# Patient Record
Sex: Female | Born: 1950
Health system: Southern US, Community
[De-identification: ages and names within clinical notes are randomized; demographics above are authoritative.]

## PROBLEM LIST (undated history)

## (undated) DIAGNOSIS — K219 Gastro-esophageal reflux disease without esophagitis: Secondary | ICD-10-CM

## (undated) DIAGNOSIS — E039 Hypothyroidism, unspecified: Secondary | ICD-10-CM

## (undated) DIAGNOSIS — E119 Type 2 diabetes mellitus without complications: Secondary | ICD-10-CM

## (undated) DIAGNOSIS — F419 Anxiety disorder, unspecified: Secondary | ICD-10-CM

## (undated) DIAGNOSIS — K5900 Constipation, unspecified: Secondary | ICD-10-CM

## (undated) DIAGNOSIS — M1990 Unspecified osteoarthritis, unspecified site: Secondary | ICD-10-CM

## (undated) DIAGNOSIS — R112 Nausea with vomiting, unspecified: Secondary | ICD-10-CM

## (undated) DIAGNOSIS — G8929 Other chronic pain: Secondary | ICD-10-CM

## (undated) DIAGNOSIS — Z9889 Other specified postprocedural states: Secondary | ICD-10-CM

## (undated) HISTORY — DX: Constipation, unspecified: K59.00

## (undated) HISTORY — DX: Gastro-esophageal reflux disease without esophagitis: K21.9

## (undated) HISTORY — PX: ABDOMINAL HYSTERECTOMY: SHX81

## (undated) HISTORY — DX: Other chronic pain: G89.29

## (undated) HISTORY — DX: Type 2 diabetes mellitus without complications: E11.9

## (undated) HISTORY — DX: Hypothyroidism, unspecified: E03.9

## (undated) HISTORY — PX: COLONOSCOPY: SHX174

## (undated) HISTORY — PX: PILONIDAL CYST EXCISION: SHX744

## (undated) HISTORY — PX: TONSILLECTOMY: SUR1361

## (undated) HISTORY — PX: ESOPHAGOGASTRODUODENOSCOPY: SHX1529

## (undated) HISTORY — PX: CHOLECYSTECTOMY: SHX55

---

## 2000-03-07 ENCOUNTER — Ambulatory Visit (HOSPITAL_COMMUNITY): Admission: RE | Admit: 2000-03-07 | Discharge: 2000-03-07 | Payer: Self-pay | Admitting: Gastroenterology

## 2000-03-07 ENCOUNTER — Encounter: Payer: Self-pay | Admitting: Gastroenterology

## 2000-12-17 ENCOUNTER — Encounter: Payer: Self-pay | Admitting: Internal Medicine

## 2000-12-17 ENCOUNTER — Ambulatory Visit (HOSPITAL_COMMUNITY): Admission: RE | Admit: 2000-12-17 | Discharge: 2000-12-17 | Payer: Self-pay | Admitting: Internal Medicine

## 2003-08-14 ENCOUNTER — Ambulatory Visit (HOSPITAL_COMMUNITY): Admission: RE | Admit: 2003-08-14 | Discharge: 2003-08-14 | Payer: Self-pay | Admitting: Pulmonary Disease

## 2004-08-04 ENCOUNTER — Ambulatory Visit (HOSPITAL_COMMUNITY): Admission: RE | Admit: 2004-08-04 | Discharge: 2004-08-04 | Payer: Self-pay | Admitting: Pulmonary Disease

## 2004-08-04 ENCOUNTER — Encounter: Payer: Self-pay | Admitting: Orthopedic Surgery

## 2004-08-25 ENCOUNTER — Encounter (HOSPITAL_COMMUNITY): Admission: RE | Admit: 2004-08-25 | Discharge: 2004-09-24 | Payer: Self-pay | Admitting: Neurosurgery

## 2004-09-29 ENCOUNTER — Encounter (HOSPITAL_COMMUNITY): Admission: RE | Admit: 2004-09-29 | Discharge: 2004-10-29 | Payer: Self-pay | Admitting: Neurosurgery

## 2005-02-07 ENCOUNTER — Ambulatory Visit (HOSPITAL_COMMUNITY): Admission: RE | Admit: 2005-02-07 | Discharge: 2005-02-07 | Payer: Self-pay | Admitting: Pulmonary Disease

## 2006-02-05 ENCOUNTER — Ambulatory Visit (HOSPITAL_COMMUNITY): Admission: RE | Admit: 2006-02-05 | Discharge: 2006-02-05 | Payer: Self-pay | Admitting: Pulmonary Disease

## 2006-02-14 ENCOUNTER — Ambulatory Visit: Payer: Self-pay | Admitting: Cardiology

## 2006-02-19 ENCOUNTER — Encounter (HOSPITAL_COMMUNITY): Admission: RE | Admit: 2006-02-19 | Discharge: 2006-03-21 | Payer: Self-pay | Admitting: Cardiology

## 2006-02-20 ENCOUNTER — Ambulatory Visit: Payer: Self-pay | Admitting: Cardiology

## 2006-03-15 ENCOUNTER — Ambulatory Visit: Payer: Self-pay | Admitting: Cardiology

## 2006-06-20 ENCOUNTER — Ambulatory Visit (HOSPITAL_COMMUNITY): Admission: RE | Admit: 2006-06-20 | Discharge: 2006-06-20 | Payer: Self-pay | Admitting: Pulmonary Disease

## 2006-06-21 ENCOUNTER — Ambulatory Visit: Payer: Self-pay | Admitting: Orthopedic Surgery

## 2006-08-22 ENCOUNTER — Ambulatory Visit: Payer: Self-pay | Admitting: Orthopedic Surgery

## 2008-07-06 ENCOUNTER — Ambulatory Visit: Payer: Self-pay | Admitting: Gastroenterology

## 2008-07-20 ENCOUNTER — Encounter: Payer: Self-pay | Admitting: Internal Medicine

## 2008-07-20 ENCOUNTER — Ambulatory Visit: Payer: Self-pay | Admitting: Internal Medicine

## 2008-07-20 ENCOUNTER — Ambulatory Visit (HOSPITAL_COMMUNITY): Admission: RE | Admit: 2008-07-20 | Discharge: 2008-07-20 | Payer: Self-pay | Admitting: Internal Medicine

## 2008-09-18 ENCOUNTER — Encounter (INDEPENDENT_AMBULATORY_CARE_PROVIDER_SITE_OTHER): Payer: Self-pay | Admitting: *Deleted

## 2008-10-07 ENCOUNTER — Ambulatory Visit (HOSPITAL_COMMUNITY): Admission: RE | Admit: 2008-10-07 | Discharge: 2008-10-07 | Payer: Self-pay | Admitting: Pulmonary Disease

## 2008-10-12 DIAGNOSIS — E039 Hypothyroidism, unspecified: Secondary | ICD-10-CM | POA: Insufficient documentation

## 2008-10-12 DIAGNOSIS — R109 Unspecified abdominal pain: Secondary | ICD-10-CM | POA: Insufficient documentation

## 2008-10-12 DIAGNOSIS — R198 Other specified symptoms and signs involving the digestive system and abdomen: Secondary | ICD-10-CM | POA: Insufficient documentation

## 2008-10-12 DIAGNOSIS — M109 Gout, unspecified: Secondary | ICD-10-CM | POA: Insufficient documentation

## 2008-10-12 DIAGNOSIS — G43909 Migraine, unspecified, not intractable, without status migrainosus: Secondary | ICD-10-CM | POA: Insufficient documentation

## 2008-10-12 DIAGNOSIS — F341 Dysthymic disorder: Secondary | ICD-10-CM | POA: Insufficient documentation

## 2008-10-12 DIAGNOSIS — K5900 Constipation, unspecified: Secondary | ICD-10-CM | POA: Insufficient documentation

## 2008-10-12 DIAGNOSIS — E669 Obesity, unspecified: Secondary | ICD-10-CM | POA: Insufficient documentation

## 2008-10-12 DIAGNOSIS — R142 Eructation: Secondary | ICD-10-CM

## 2008-10-12 DIAGNOSIS — K222 Esophageal obstruction: Secondary | ICD-10-CM | POA: Insufficient documentation

## 2008-10-12 DIAGNOSIS — R1319 Other dysphagia: Secondary | ICD-10-CM | POA: Insufficient documentation

## 2008-10-12 DIAGNOSIS — K219 Gastro-esophageal reflux disease without esophagitis: Secondary | ICD-10-CM | POA: Insufficient documentation

## 2008-10-12 DIAGNOSIS — R141 Gas pain: Secondary | ICD-10-CM | POA: Insufficient documentation

## 2008-10-12 DIAGNOSIS — R143 Flatulence: Secondary | ICD-10-CM

## 2008-10-12 DIAGNOSIS — K3184 Gastroparesis: Secondary | ICD-10-CM | POA: Insufficient documentation

## 2008-10-12 DIAGNOSIS — R1010 Upper abdominal pain, unspecified: Secondary | ICD-10-CM | POA: Insufficient documentation

## 2009-06-01 ENCOUNTER — Encounter: Payer: Self-pay | Admitting: Orthopedic Surgery

## 2009-10-20 ENCOUNTER — Encounter: Payer: Self-pay | Admitting: Gastroenterology

## 2010-08-02 NOTE — Medication Information (Signed)
Summary: Tax adviser   Imported By: Diana Eves 10/20/2009 16:34:17  _____________________________________________________________________  External Attachment:    Type:   Image     Comment:   External Document  Appended Document: RX Folder Pt last seen in 2010. Nexium 40 mg daily #30 rfx2. Pt needs OPV w/ RMR.  Appended Document: RX Folder pharmacy aware  Appended Document: RX Folder pt aware of appt for 12/01/09 at 1am w/RMR

## 2010-11-15 NOTE — Op Note (Signed)
NAME:  Whitney Reed, Whitney Reed            ACCOUNT NO.:  [id number]   MEDICAL RECORD NO.:  [id number]          PATIENT TYPE:  AMB   LOCATION:  DAY                           FACILITY:  [facility]   PHYSICIAN:  R. Roetta Sessions, M.D. DATE OF BIRTH:  01/21/1951   DATE OF PROCEDURE:  07/20/2008  DATE OF DISCHARGE:                                PROCEDURE NOTE   INDICATIONS FOR PROCEDURE:  A 60 year old lady with longstanding chronic  reflux symptoms and recurrent esophageal dysphagia, history of Schatzki  ring, also alteration in bowel habits with thin stools and constipation.  She previously had a Schatzki ring dilated back in 1997.  No family  history of colon cancer or polyps.  She has never had her lower GI tract  imaged.  EGD and colonoscopy now being done.  Potential for esophageal  dilation reviewed.  Risks, benefits, alternatives, and limitations of  this approach have been discussed previously when I came to the bedside.  Please see the documentation in the medical record.  All parties  questions answered and all parties agreeable.   PROCEDURE NOTE:  O2 saturation, blood pressure, pulse, and respirations  were monitored throughout the entire procedure.   CONSCIOUS SEDATION:  Versed 5 mg IV, Demerol 100 mg IV in divided doses,  Phenergan 12.5 mg IV diluted slow IV push to augment conscious sedation.   INSTRUMENTATION:  Pentax video chip system.   FINDINGS:  EGD examination of the tubular esophagus revealed a prominent  Schatzki ring.  Esophageal mucosa otherwise appeared unremarkable.  EG  junction easily traversed.  Stomach:  Gastric cavity was emptied and  insufflated well with air.  Thorough examination of the gastric mucosa  including retroflexed view of the proximal stomach esophagogastric  junction demonstrated no gastric mucosal abnormalities.  Pylorus was  patent, easily traversed.  Examination of the bulb and second portion  revealed no abnormalities.  Therapeutic/diagnostic  maneuvers performed.  Scope was withdrawn.  A 54-French Maloney dilator was passed in full  insertion with ease.  A look back revealed ring had been ruptured  without apparent complication.  The patient tolerated the procedure well  and was also prepared for colonoscopy.  Digital rectal exam revealed no  abnormalities.  Endoscopic Findings:  Prep was adequate.  Colon:  Colonic mucosa was surveyed from the rectosigmoid junction through the  left transverse, right colon to the appendiceal orifice, ileocecal  valve, and cecum.  These structures were well seen and photographed for  the record.  From this level, scope was slowly and cautiously withdrawn.  All previously mentioned mucosal surfaces were again seen.  The patient  has scattered left-sided diverticula.  Remainder of the colonic mucosa  appeared normal.  Scope was pulled down the rectum.  Thorough  examination of the rectal mucosa including retroflexed view of the anal  verge demonstrated three 5-mm polyps which were cold snared.  Remainder  of the rectal mucosa appeared unremarkable.  The patient tolerated the  procedure well and was reactive in endoscopy.   IMPRESSION:  1. EGD, prominent Schatzki  ring, otherwise normal esophagus, stomach,      D1,  D2 status post Maloney dilation as described above.  2. Colonoscopy findings, three diminutive polyps in the rectum status      post cold snare removal, otherwise unremarkable rectum.  3. Left-sided diverticula, remainder of the colonic mucosa appeared      normal.   RECOMMENDATIONS:  1. Continue Nexium 40 mg orally daily indefinitely.  2. Daily Benefiber supplement 1 tablespoon daily.  3. MiraLax 70 g orally at bedtime p.r.n. constipation.  4. Followup on path.  5. Followup appointment with [place] in 3 months.      Jonathon Bellows, M.D.  Electronically Signed     RMR/MEDQ  D:  07/20/2008  T:  07/20/2008  Job:  [id number]   cc:   Ramon Dredge L. Juanetta Gosling, M.D.  Fax: [phone]

## 2010-11-15 NOTE — Consult Note (Signed)
NAME:  Whitney Reed            ACCOUNT NO.:  [id number]   MEDICAL RECORD NO.:  [id number]          PATIENT TYPE:  AMB   LOCATION:  DAY                           FACILITY:  [facility]   PHYSICIAN:  R. Roetta Sessions, M.D. DATE OF BIRTH:  09/04/1950   DATE OF CONSULTATION:  07/06/2008  DATE OF DISCHARGE:                                 CONSULTATION   PHYSICIAN REQUESTING CONSULTATION:  Oneal Deputy. Juanetta Gosling, MD   REASON FOR CONSULTATION:  Abdominal pain and swelling.   HISTORY OF PRESENT ILLNESS:  [name] is a 60 year old lady who  presents today with complaints of abdominal pain.  She has multiple  other GI issues as well.  She complains of chronic frequent GERD.  She  takes Prilosec occasionally, but does not take anything on a regular  basis.  She does have solid food dysphagia and finds herself chewing her  food very thoroughly but still had food get lodged in her esophagus  about once every 2 weeks.  She induces vomiting at that time.  Denies  any other vomiting episodes.  She does have early satiety and abdominal  bloating postprandially.  She has a history of gastroparesis, but came  off of Reglan at some point since we last saw her.  She thinks it is  because she could not afford the medication.  She complains of  constipation.  She notes she used to have frequent postprandial loose  stools, but now she has thin solid stools about every 3 days.  Denies  any blood in the stool or melena.  She has never had a colonoscopy.   CURRENT MEDICATIONS:  1. Cymbalta 60 mg daily.  2. Synthroid 175 mcg daily.  3. Xanax 0.5 mg p.r.n.   ALLERGIES:  ERYTHROMYCIN causes rash.   PAST MEDICAL HISTORY:  Hypothyroidism, migraine headaches, anxiety,  depression, morbid obesity, history of gout, gastroparesis, but no  longer on Reglan.   EGD back in June 1997.  She had distal esophageal erosions consistent  with mild-to-moderate erosive reflux esophagitis, distal Schatzki B  ring, status post  rupture with passage of 56-French Maloney dilator, 3-  cm hiatal hernia.  No prior colonoscopy.   PAST SURGICAL HISTORY:  Complete hysterectomy, cholecystectomy,  tonsillectomy, and a cyst removed from her lower spine.   FAMILY HISTORY:  Mother is 23 years old, recently had an episode of  pancreatitis of unclear etiology.  Also has a history of blood clots.  Father deceased at age 107 due to stroke, brother had MI and diabetes.  No family history of colorectal cancer.   SOCIAL HISTORY:  She is married.  She has 2 children.  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  She has never been a smoker.  No alcohol use.   REVIEW OF SYSTEMS:  See HPI for GI.  CONSTITUTIONAL:  Denies any weight  loss.  CARDIOPULMONARY:  No chest pain, shortness of breath,  palpitations, or cough.  GENITOURINARY:  No dysuria or hematuria.   PHYSICAL EXAMINATION:  VITAL SIGNS:  Weight 269, height 5 feet 4, temp  98.1, blood pressure 120/86, and  pulse 72.  GENERAL:  A pleasant obese Caucasian female in no acute distress.  SKIN:  Warm and dry.  No jaundice.  HEENT:  Sclerae nonicteric.  Oropharyngeal mucosa moist and pink.  CHEST:  Lungs are clear to auscultation.  CARDIAC:  Regular rate and rhythm.  No murmurs, rubs, or gallops.  ABDOMEN:  Massively obese and soft.  No organomegaly or masses  appreciated, but limited due to body habitus.  Abdomen is nontender.  LOWER EXTREMITIES:  No edema.   LABORATORY DATA:  From May 06, 2008, BUN 16, creatinine 0.84, total  cholesterol 215, triglycerides 164, LDL 125, total bilirubin 0.6,  alkaline phosphatase 57, AST 21, ALT 15, albumin 4.1, and TSH 4.091.   IMPRESSION:  The patient is a 60 year old lady with morbid obesity who  has a prior history of gastroparesis who presents with complaints of  gastroesophageal reflux disease, dysphagia to solid foods, and early  satiety.  She also has change in bowel movements with now more of  constipation and abdominal  discomfort and bloating.  She may have a  recurrent Schatzki ring, complications from gastroesophageal reflux  disease.  She likely continues to have gastroparesis, although symptoms  are somewhat mild.   PLAN:  1. Colonoscopy and EGD in the near future with Dr. Jena Gauss.  I have      discussed risk, alternatives, and benefits with the patient with      regards to, but not limited to, the risk of reaction with      medications, bleeding, infection, and perforation, and she is      agreeable to proceed.  2. Trial of Digestive Advantage Probiotics 1 daily, #30.  3. Nexium 40 mg daily, #20 samples provided.  4. Further recommendations to follow.  She may need to ultimately go      back on Reglan, but we will await her upcoming workup before making      a decision.      Tana Coast, P.AJonathon Bellows, M.D.  Electronically Signed    LL/MEDQ  D:  07/06/2008  T:  07/07/2008  Job:  [id number]   cc:   Ramon Dredge L. Juanetta Gosling, M.D.  Fax: [phone]

## 2010-11-18 NOTE — Letter (Signed)
March 15, 2006     Ramon Dredge L. Juanetta Gosling, M.D.  [address]  [place], [place] [address]   RE:  Whitney Reed, Whitney Reed  MRN:  [id number]  /  DOB:  09-25-1950   Dear Ed:   Whitney Reed returns to the office following a quite good stress test.  She  achieved a work load of 9 METS and a heart rate of 164.  There was neither  electrocardiographic evidence nor scintigraphic evidence for ischemia or  infarction.  She has continued to be symptomatic, experiencing episodes of  palpitations, chest discomfort and dyspnea, typically at work.  She  occasionally takes Valium 5 mg for this with uncertain benefit.  She has  resumed her exercise program, but has gained 25 pounds from her lowest  weight.   CURRENT MEDICATIONS:  Include  1. Cymbalta 60 mg daily.  2. Levothyroxine 0.2 mg daily.   The patient has had some daytime somnolence and wonders if this could be due  to Cymbalta.   EXAM:  GENERAL:  Overweight, pleasant woman.  VITAL SIGNS:  The weight is 250, five pounds more than at her last visit.  Blood pressure 120/85.  Heart rate 82 and regular.  Respirations 16.  NECK:  No jugular venous distention.  LUNGS:  Clear.  CARDIAC:  Normal first and second heart sounds; fourth heart sound present.  ABDOMEN:  Soft and nontender.   IMPRESSION:  Whitney Reed continues symptomatic, but with no evidence for  significant cardiac disease by a stress nuclear study.  We discussed the  possibility of event recording looking for arrhythmia.  I think this is  unlikely to be constructive.  Likewise, we discussed the possibility of  empiric use of beta blockers.  I discouraged this since she has no other  indication for this medication.  I suggested she try higher doses of valium  as needed for anxiety.  If she continues to have problem with palpitations,  we probably should proceed with event recording.  She will call me if this  is the case.    Sincerely,      Gerrit Friends. Dietrich Pates, MD, [facility]   RMR/MedQ  DD:  03/15/2006  DT:  03/16/2006  Job #:  [id number]

## 2010-11-18 NOTE — Letter (Signed)
February 14, 2006     Whitney Dredge L. Juanetta Gosling, MD  [address]  [place], [place] [address]   RE:  Whitney Reed, Reed  MRN:  [id number]  /  DOB:  [birth year]   Dear Whitney Reed Reed:   It was my pleasure evaluating Whitney Reed Reed in the office today in  consultation at your request.  As you know, this nice woman has enjoyed  generally excellent health.  She has no known cardiovascular disease and few  cardiovascular risk factors.  She has a long history of atypical chest  discomfort.  She describes the sudden onset of lower chest substernal sharp  pain that occurs unpredictably.  It is nonradiating.  It is moderate in  severity.  There are no clear precipitants.  She is unaware of anything she  can do to relieve the discomfort which waxes and wanes over minutes to  hours.  She occasionally experienced palpitations, which may or may not  accompany chest discomfort.  The principal issue has been aggressive dyspnea  on exertion.  She has had impaired exercise capacity in recent weeks or  months.  This has been contemporaneous with exacerbation of chronic back  problems and a decrease in her level of exercise.  She has had a chronic  problem with obesity, but has lost 80 pounds, down to a minimum weight of  approximately 200.  She recently regained 20 pounds.   She has not been a cigarette smoker.  There is a family history of diabetes,  but no personal history.  She has not had hypertension.  Lipid values have  been average.   PAST MEDICAL HISTORY:  Otherwise notable for GERD that has been followed by  our gastroenterologist in the past.  She underwent TAHBSO in 1985 when she  was in her early 89s.  She has also undergone cholecystectomy.   CURRENT MEDICATIONS:  Include Cymbalta 60 mg daily and Levothyroxine 0.2 mg  daily.  She has no known medical allergies.   SOCIAL HISTORY:  She works as a Production designer, theatre/television/film at [employer]; sedentary lifestyle;  married with two children.  No excessive use of alcohol.   FAMILY  HISTORY:  Notable for multiple members with diabetes.  Her father  suffered a fatal stroke at age 19.  Her mother is alive and well at age 7.  She has had one brother who has suffered a myocardial infarction.   REVIEW OF SYMPTOMS:  Notable for fairly frequent headaches, some dizziness,  the need for corrective lenses, some impairment in her hearing, partial  lower dentures, intermittent diarrhea and constipation, urinary frequency,  degenerative joint disease most notably of her back but also involving her  hands and knees.  She notes occasional ankle edema.  She has had asthma in  the past.   PHYSICAL EXAMINATION:  GENERAL:  Overweight pleasant woman in no acute  distress.  VITAL SIGNS:  The weight is 245.  Blood pressure 120/85, heart rate 70 and  regular, respiratory rate 16.  HEENT:  Anicteric sclerae; pupils equal, round and reactive to light; normal  oral mucosa.  NECK:  No JVD; normal carotid upstrokes without bruits.  ENDOCRINE:  No thyromegaly.  HEMATOPOETA:  No adenopathy.  LUNGS:  Clear to auscultation.  CARDIAC:  Normal first and second heart sounds; PMI not appreciated.  ABDOMEN:  Soft and nontender; normal bowel sounds; no organomegaly; no  masses.  EXTREMITIES:  No edema; distal pulses intact.  NEUROMUSCULAR:  Symmetric strength and tone; normal cranial nerves.  MUSCULOSKELETAL:  No  joint deformities.  SKIN:  No significant lesions.  PSYCHIATRIC:  Normal affect, alert and oriented.   EKG:  Normal sinus rhythm; minor nonspecific T wave abnormalities;  nondiagnostic inferior Q waves; delayed R wave progression.  No prior  tracing for comparison.   Chest x-ray:  Mild bronchitic changes; mild cardiomegaly; no acute  abnormalities.   Lipid profile from your office showed a total cholesterol of 214,  triglycerides of 191, HDL 49 and LDL 127.   IMPRESSION:  Whitney Reed Reed has long-standing nonspecific symptoms of  palpitations and chest discomfort.  She has had a  recent increase in  exertional dyspnea that may be related to physical inactivity and some  regain of previously lost weight.  A stress nuclear study will provide  [place] with objective evidence about exercise plus rule out significant ischemic  heart disease and evaluate left ventricular systolic function.  I will meet  with her again after that examination has been performed.    Sincerely,      Whitney Friends. Dietrich Pates, MD, [facility]   RMR/MedQ  DD:  02/14/2006  DT:  02/14/2006  Job #:  [id number]

## 2011-06-29 ENCOUNTER — Ambulatory Visit (HOSPITAL_COMMUNITY)
Admission: RE | Admit: 2011-06-29 | Discharge: 2011-06-29 | Disposition: A | Discharge status: Home / Self Care | Payer: PRIVATE HEALTH INSURANCE | Source: Ambulatory Visit | Attending: Pulmonary Disease | Admitting: Pulmonary Disease

## 2011-06-29 DIAGNOSIS — R05 Cough: Secondary | ICD-10-CM | POA: Insufficient documentation

## 2011-06-29 DIAGNOSIS — R0609 Other forms of dyspnea: Secondary | ICD-10-CM | POA: Insufficient documentation

## 2011-06-29 DIAGNOSIS — R059 Cough, unspecified: Secondary | ICD-10-CM | POA: Insufficient documentation

## 2011-06-29 DIAGNOSIS — R0989 Other specified symptoms and signs involving the circulatory and respiratory systems: Secondary | ICD-10-CM | POA: Insufficient documentation

## 2011-06-29 NOTE — Procedures (Signed)
NAMECAITRIN, Whitney Reed            ACCOUNT NO.:  [id number]  MEDICAL RECORD NO.:  [id number]  LOCATION:  RESP                          FACILITY:  [facility]  PHYSICIAN:  Colleena Kurtenbach L. Juanetta Gosling, M.D.DATE OF BIRTH:  07/23/1950  DATE OF PROCEDURE: DATE OF DISCHARGE:                           PULMONARY FUNCTION TEST   Reason for pulmonary function testing is cough.  1. Spirometry shows normal. 2. Lung volumes are normal. 3. DLCO is slightly reduced, but does correct for volume.  4.  This is     an essentially normal pulmonary function test.     Ramon Dredge L. Juanetta Gosling, M.D.     ELH/MEDQ  D:  06/29/2011  T:  06/29/2011  Job:  [id number]

## 2015-07-15 ENCOUNTER — Ambulatory Visit: Payer: Self-pay | Admitting: "Endocrinology

## 2016-05-09 ENCOUNTER — Other Ambulatory Visit (HOSPITAL_COMMUNITY): Payer: Self-pay | Admitting: Pulmonary Disease

## 2016-05-09 ENCOUNTER — Ambulatory Visit (HOSPITAL_COMMUNITY)
Admission: RE | Admit: 2016-05-09 | Discharge: 2016-05-09 | Disposition: A | Discharge status: Home / Self Care | Payer: Self-pay | Source: Ambulatory Visit | Attending: Pulmonary Disease | Admitting: Pulmonary Disease

## 2016-05-09 DIAGNOSIS — M79642 Pain in left hand: Principal | ICD-10-CM

## 2016-05-09 DIAGNOSIS — M25551 Pain in right hip: Secondary | ICD-10-CM

## 2016-05-09 DIAGNOSIS — M79641 Pain in right hand: Secondary | ICD-10-CM

## 2016-05-09 DIAGNOSIS — M545 Low back pain: Secondary | ICD-10-CM

## 2016-05-09 DIAGNOSIS — M25552 Pain in left hip: Secondary | ICD-10-CM | POA: Insufficient documentation

## 2016-05-09 DIAGNOSIS — M4316 Spondylolisthesis, lumbar region: Secondary | ICD-10-CM | POA: Insufficient documentation

## 2016-05-09 DIAGNOSIS — M47896 Other spondylosis, lumbar region: Secondary | ICD-10-CM | POA: Insufficient documentation

## 2016-11-29 DIAGNOSIS — M25562 Pain in left knee: Secondary | ICD-10-CM | POA: Diagnosis not present

## 2016-11-29 DIAGNOSIS — M170 Bilateral primary osteoarthritis of knee: Secondary | ICD-10-CM | POA: Diagnosis not present

## 2016-11-29 DIAGNOSIS — M25561 Pain in right knee: Secondary | ICD-10-CM | POA: Diagnosis not present

## 2017-01-20 DIAGNOSIS — I10 Essential (primary) hypertension: Secondary | ICD-10-CM | POA: Diagnosis not present

## 2017-01-20 DIAGNOSIS — R69 Illness, unspecified: Secondary | ICD-10-CM | POA: Diagnosis not present

## 2017-01-20 DIAGNOSIS — E039 Hypothyroidism, unspecified: Secondary | ICD-10-CM | POA: Diagnosis not present

## 2017-01-20 DIAGNOSIS — M1990 Unspecified osteoarthritis, unspecified site: Secondary | ICD-10-CM | POA: Diagnosis not present

## 2017-01-20 DIAGNOSIS — Z79899 Other long term (current) drug therapy: Secondary | ICD-10-CM | POA: Diagnosis not present

## 2017-01-20 DIAGNOSIS — M1712 Unilateral primary osteoarthritis, left knee: Secondary | ICD-10-CM | POA: Diagnosis not present

## 2017-01-20 DIAGNOSIS — M25562 Pain in left knee: Secondary | ICD-10-CM | POA: Diagnosis not present

## 2017-01-22 DIAGNOSIS — M25562 Pain in left knee: Secondary | ICD-10-CM | POA: Diagnosis not present

## 2017-01-22 DIAGNOSIS — M1712 Unilateral primary osteoarthritis, left knee: Secondary | ICD-10-CM | POA: Diagnosis not present

## 2017-03-26 DIAGNOSIS — M1712 Unilateral primary osteoarthritis, left knee: Secondary | ICD-10-CM | POA: Diagnosis not present

## 2017-03-26 DIAGNOSIS — M25562 Pain in left knee: Secondary | ICD-10-CM | POA: Diagnosis not present

## 2017-04-08 DIAGNOSIS — E039 Hypothyroidism, unspecified: Secondary | ICD-10-CM | POA: Diagnosis not present

## 2017-04-08 DIAGNOSIS — Z79899 Other long term (current) drug therapy: Secondary | ICD-10-CM | POA: Diagnosis not present

## 2017-04-08 DIAGNOSIS — R69 Illness, unspecified: Secondary | ICD-10-CM | POA: Diagnosis not present

## 2017-04-08 DIAGNOSIS — I10 Essential (primary) hypertension: Secondary | ICD-10-CM | POA: Diagnosis not present

## 2017-04-08 DIAGNOSIS — R51 Headache: Secondary | ICD-10-CM | POA: Diagnosis not present

## 2017-04-16 DIAGNOSIS — R0602 Shortness of breath: Secondary | ICD-10-CM | POA: Diagnosis not present

## 2017-04-16 DIAGNOSIS — E039 Hypothyroidism, unspecified: Secondary | ICD-10-CM | POA: Diagnosis not present

## 2017-04-16 DIAGNOSIS — Z79899 Other long term (current) drug therapy: Secondary | ICD-10-CM | POA: Diagnosis not present

## 2017-04-16 DIAGNOSIS — I10 Essential (primary) hypertension: Secondary | ICD-10-CM | POA: Diagnosis not present

## 2017-04-16 DIAGNOSIS — R69 Illness, unspecified: Secondary | ICD-10-CM | POA: Diagnosis not present

## 2017-04-16 DIAGNOSIS — J209 Acute bronchitis, unspecified: Secondary | ICD-10-CM | POA: Diagnosis not present

## 2017-04-25 DIAGNOSIS — J069 Acute upper respiratory infection, unspecified: Secondary | ICD-10-CM | POA: Diagnosis not present

## 2017-04-25 DIAGNOSIS — J209 Acute bronchitis, unspecified: Secondary | ICD-10-CM | POA: Diagnosis not present

## 2017-07-12 DIAGNOSIS — E039 Hypothyroidism, unspecified: Secondary | ICD-10-CM | POA: Diagnosis not present

## 2017-08-14 DIAGNOSIS — M1712 Unilateral primary osteoarthritis, left knee: Secondary | ICD-10-CM | POA: Diagnosis not present

## 2017-08-14 DIAGNOSIS — M25562 Pain in left knee: Secondary | ICD-10-CM | POA: Diagnosis not present

## 2017-08-14 DIAGNOSIS — Z6841 Body Mass Index (BMI) 40.0 and over, adult: Secondary | ICD-10-CM | POA: Diagnosis not present

## 2017-09-14 ENCOUNTER — Ambulatory Visit: Payer: Self-pay | Admitting: Family Medicine

## 2017-11-04 DIAGNOSIS — R69 Illness, unspecified: Secondary | ICD-10-CM | POA: Diagnosis not present

## 2017-11-04 DIAGNOSIS — G8112 Spastic hemiplegia affecting left dominant side: Secondary | ICD-10-CM | POA: Diagnosis not present

## 2017-11-04 DIAGNOSIS — I69352 Hemiplegia and hemiparesis following cerebral infarction affecting left dominant side: Secondary | ICD-10-CM | POA: Diagnosis not present

## 2017-11-15 DIAGNOSIS — R69 Illness, unspecified: Secondary | ICD-10-CM | POA: Diagnosis not present

## 2017-11-15 DIAGNOSIS — G8112 Spastic hemiplegia affecting left dominant side: Secondary | ICD-10-CM | POA: Diagnosis not present

## 2017-11-15 DIAGNOSIS — I69352 Hemiplegia and hemiparesis following cerebral infarction affecting left dominant side: Secondary | ICD-10-CM | POA: Diagnosis not present

## 2017-11-16 DIAGNOSIS — G8112 Spastic hemiplegia affecting left dominant side: Secondary | ICD-10-CM | POA: Diagnosis not present

## 2017-11-16 DIAGNOSIS — R69 Illness, unspecified: Secondary | ICD-10-CM | POA: Diagnosis not present

## 2017-11-16 DIAGNOSIS — I69352 Hemiplegia and hemiparesis following cerebral infarction affecting left dominant side: Secondary | ICD-10-CM | POA: Diagnosis not present

## 2017-11-29 DIAGNOSIS — M26629 Arthralgia of temporomandibular joint, unspecified side: Secondary | ICD-10-CM | POA: Diagnosis not present

## 2017-11-29 DIAGNOSIS — R69 Illness, unspecified: Secondary | ICD-10-CM | POA: Diagnosis not present

## 2017-11-29 DIAGNOSIS — M1712 Unilateral primary osteoarthritis, left knee: Secondary | ICD-10-CM | POA: Diagnosis not present

## 2017-11-29 DIAGNOSIS — E039 Hypothyroidism, unspecified: Secondary | ICD-10-CM | POA: Diagnosis not present

## 2017-11-29 DIAGNOSIS — R5383 Other fatigue: Secondary | ICD-10-CM | POA: Diagnosis not present

## 2017-11-29 DIAGNOSIS — Z6841 Body Mass Index (BMI) 40.0 and over, adult: Secondary | ICD-10-CM | POA: Diagnosis not present

## 2017-12-02 IMAGING — DX DG LUMBAR SPINE COMPLETE 4+V
5 series · 5 of 5 positions shown · non-contrast
Comparison: None.

CLINICAL DATA: Chronic low back pain, initial encounter

EXAM:
LUMBAR SPINE - COMPLETE 4+ VIEW

[l-spine ap]
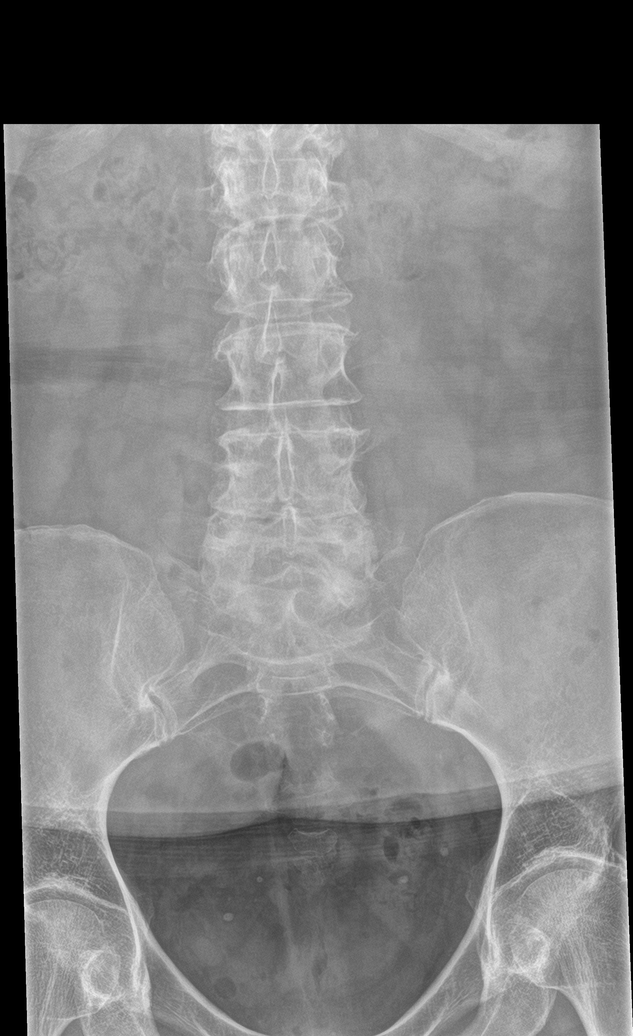

[l-spine obl (1 of 2)]
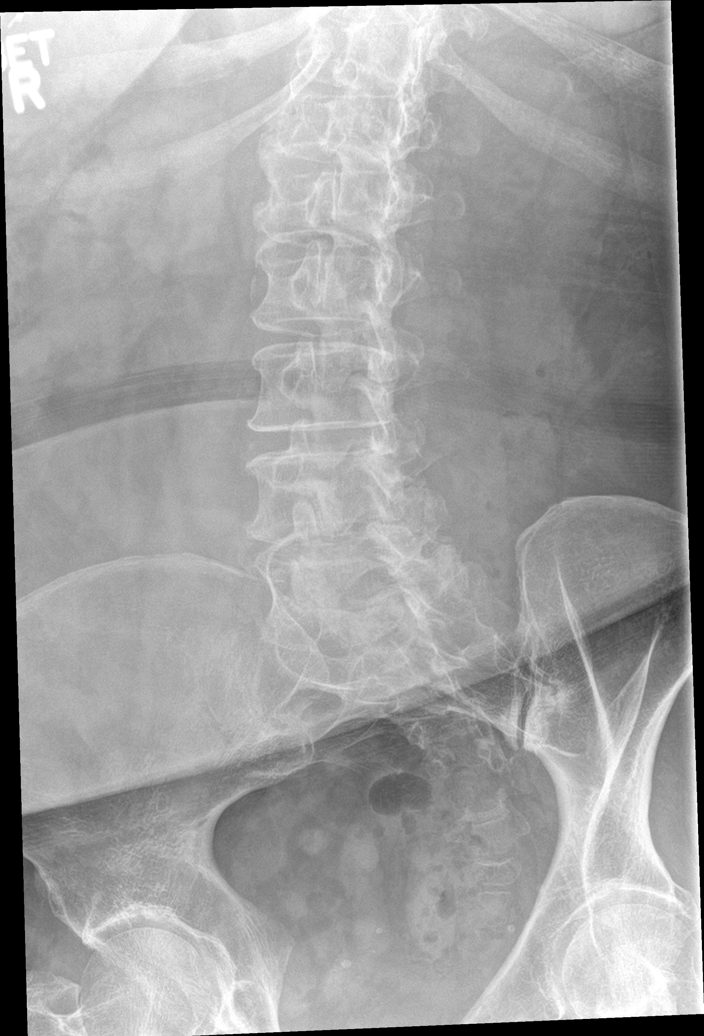

[l-spine obl (2 of 2)]
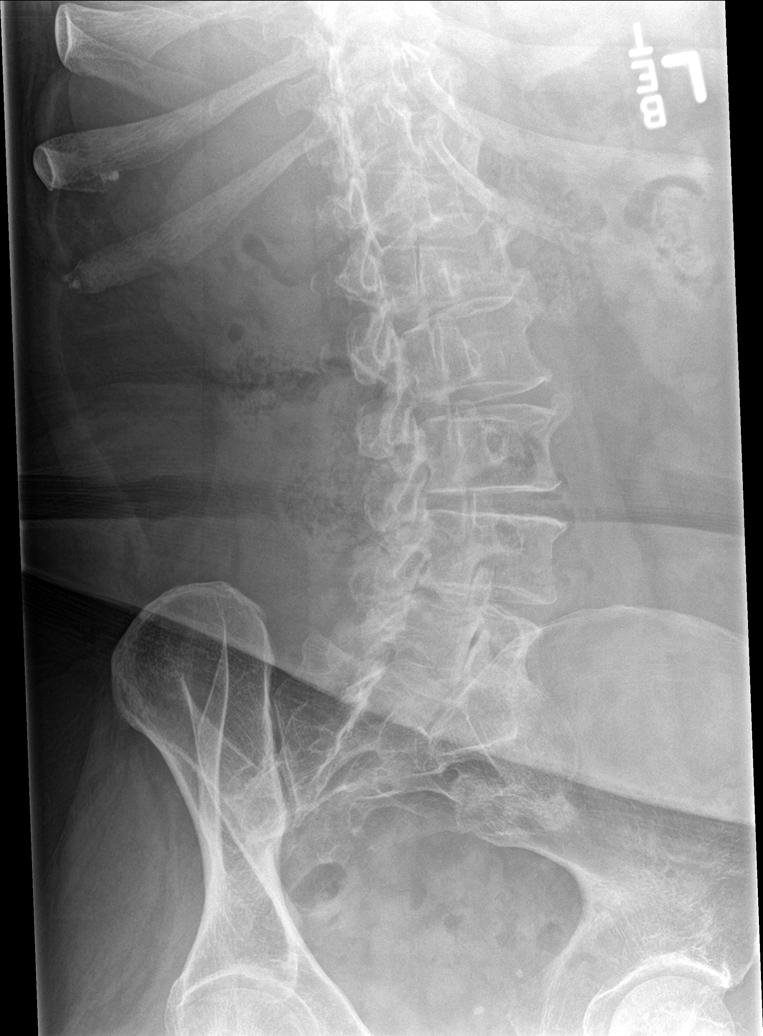

[l-spine lat]
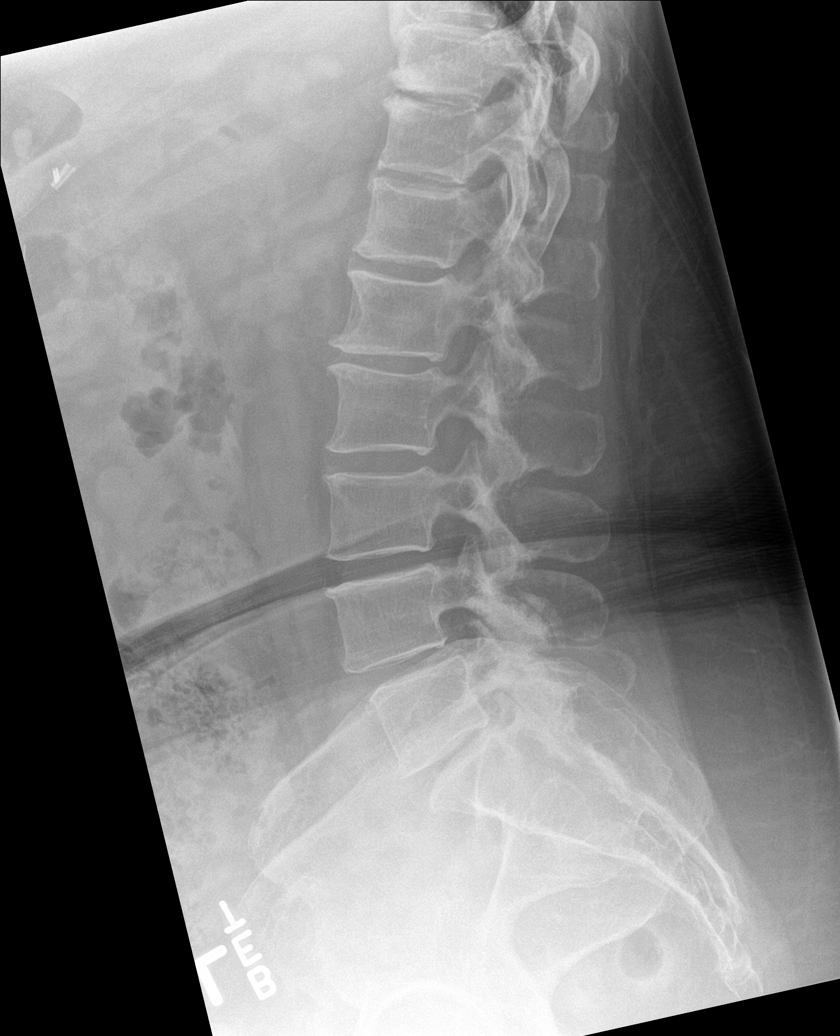

[l-spine spot]
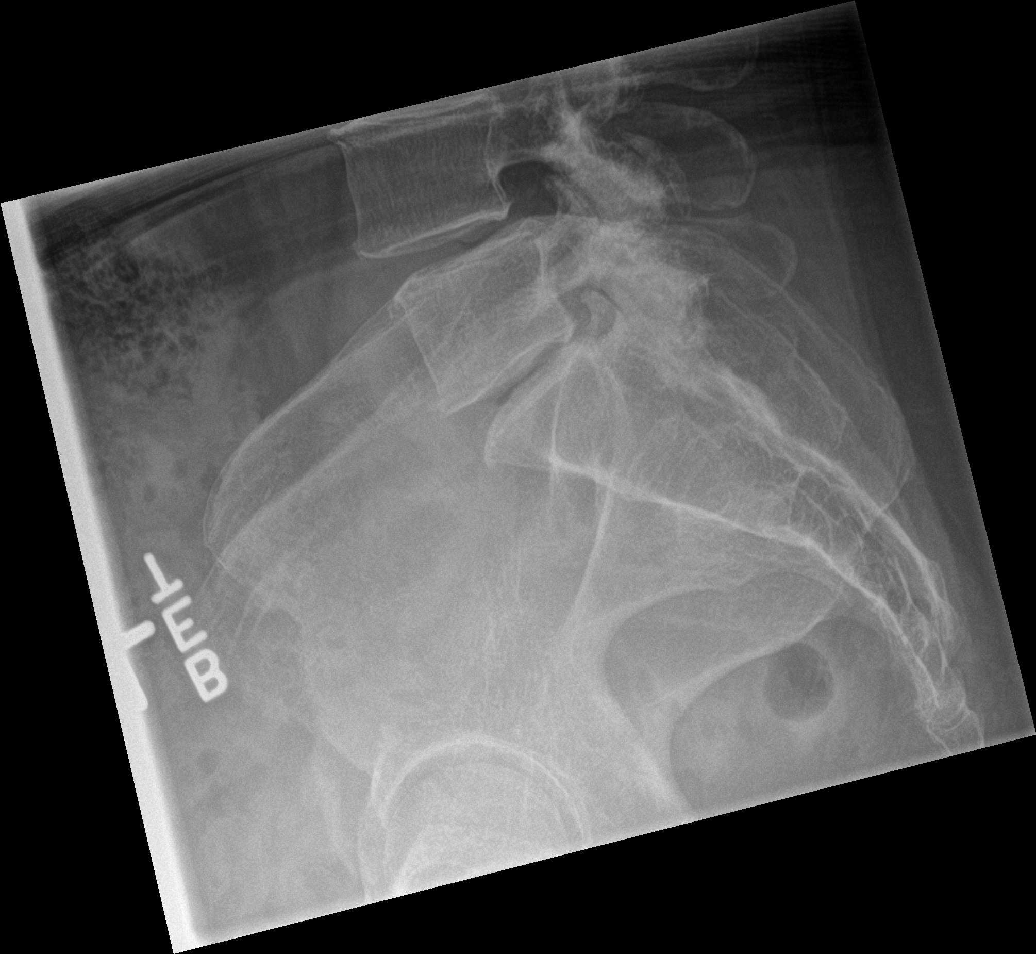

[5 of 5 positions shown; findings below may reference images not displayed]

FINDINGS: Five lumbar type vertebral bodies are well visualized. Vertebral
body height is well maintained. Very mild anterolisthesis of a
degenerative nature is noted of L4 on L5. Disc space narrowing is
noted at L4-5 and L5-S1. Mild osteophytes are noted as well. No pars
defects are seen.
IMPRESSION: Degenerative change with mild anterolisthesis of L4 on L5.

## 2017-12-02 IMAGING — DX DG HIP (WITH OR WITHOUT PELVIS) 5+V BILAT
5 series · 5 of 5 positions shown · non-contrast
Comparison: None.

CLINICAL DATA: Bilateral hip pain for several years, no known
injury, initial encounter

EXAM:
DG HIP (WITH OR WITHOUT PELVIS) 5+V BILAT

[pelvis ap]
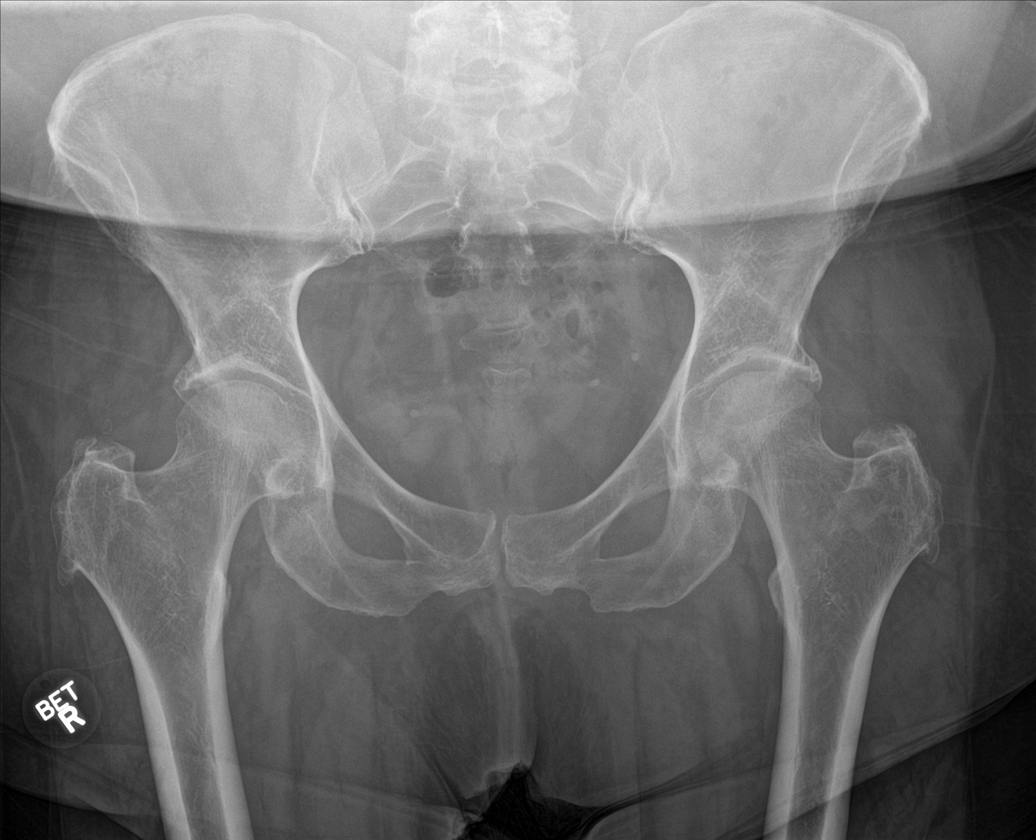

[hip ap (1 of 2)]
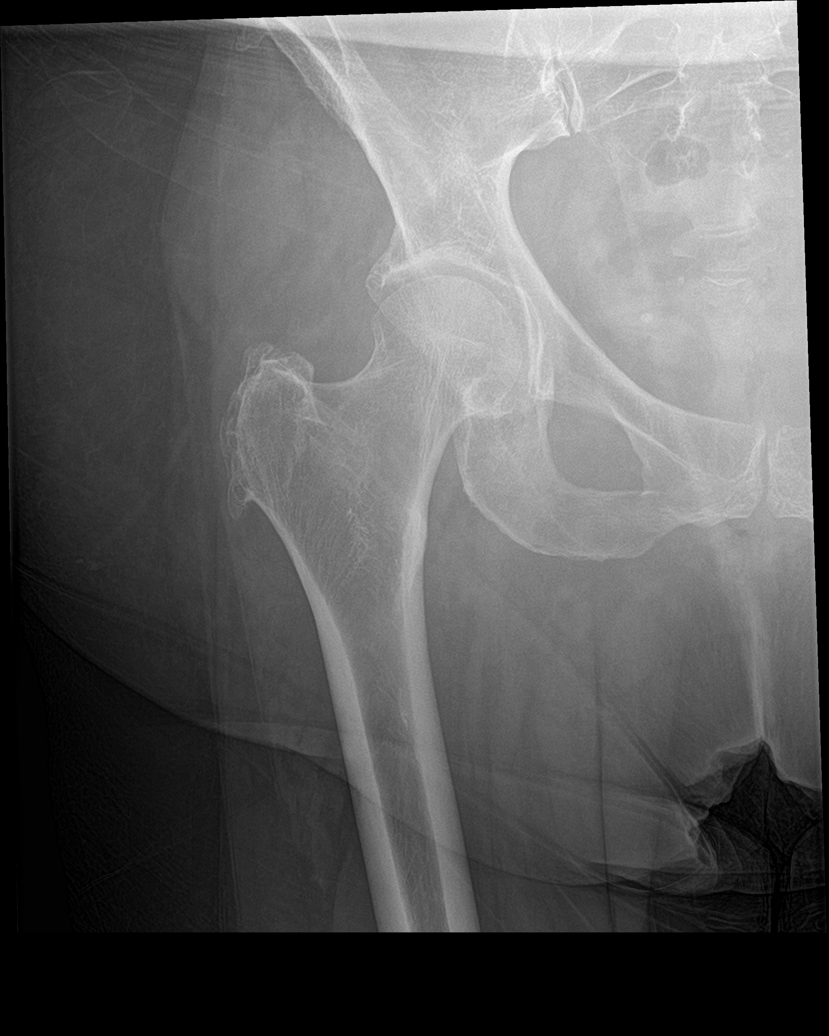

[hip lat (1 of 2)]
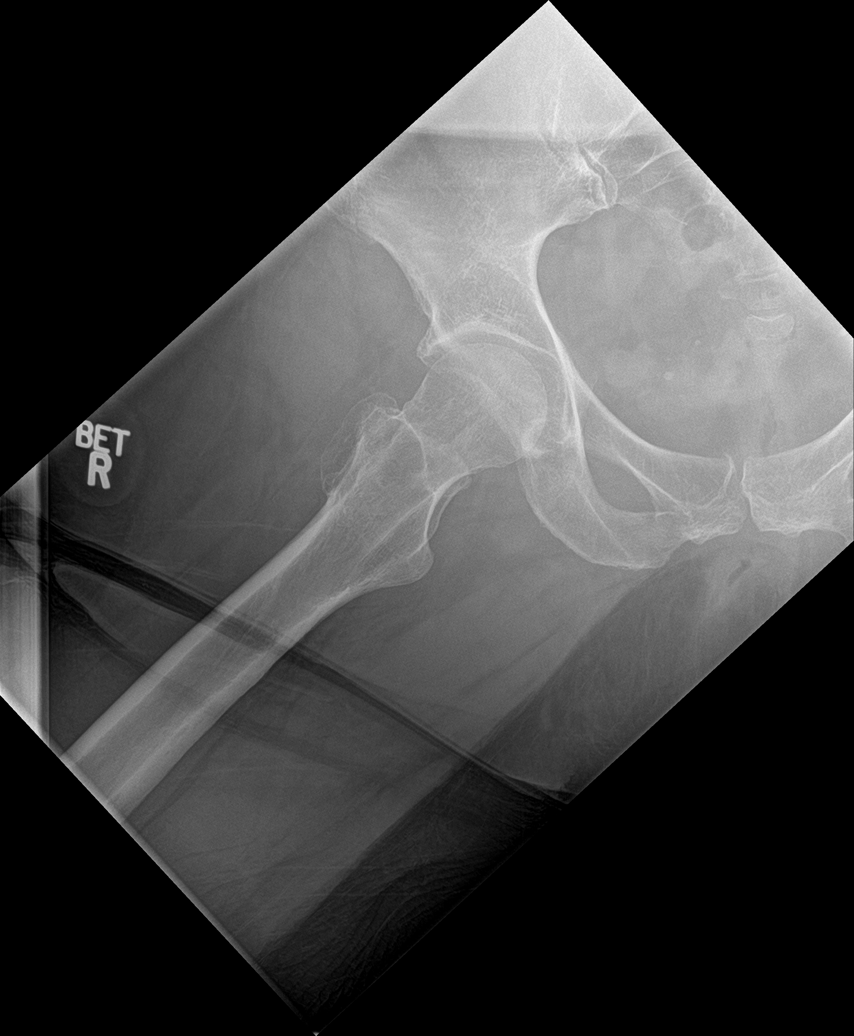

[hip ap (2 of 2)]
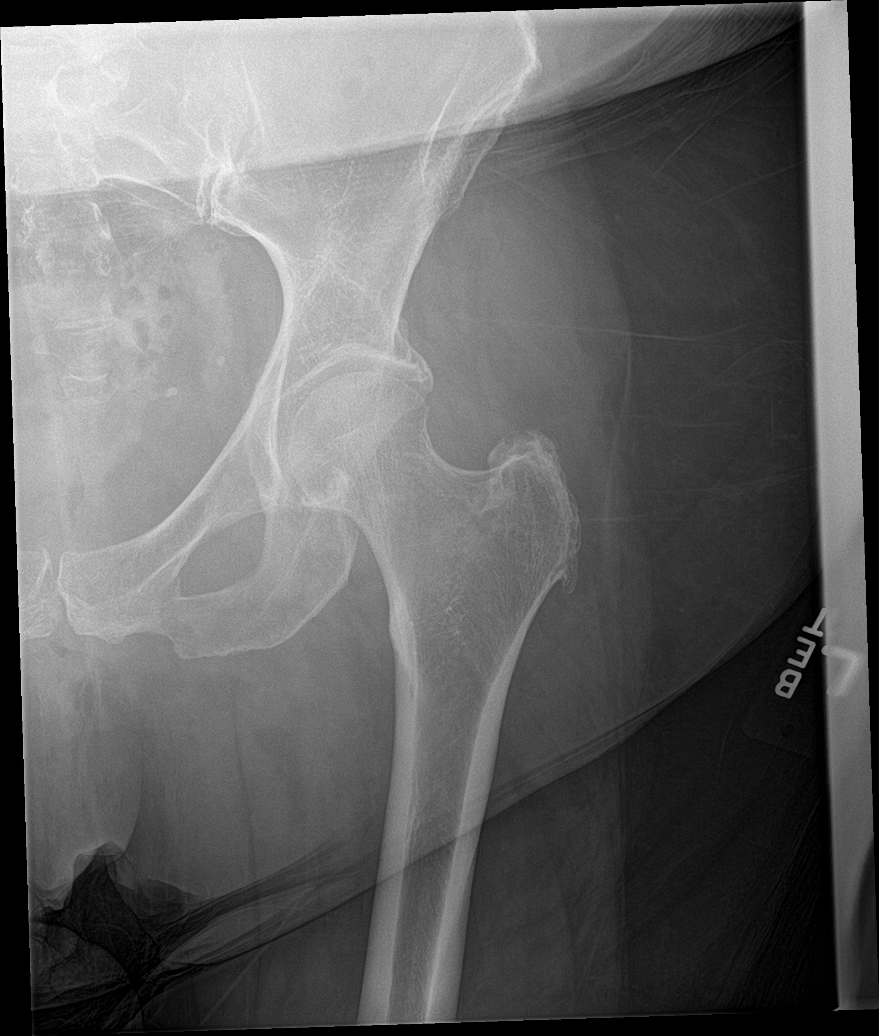

[hip lat (2 of 2)]
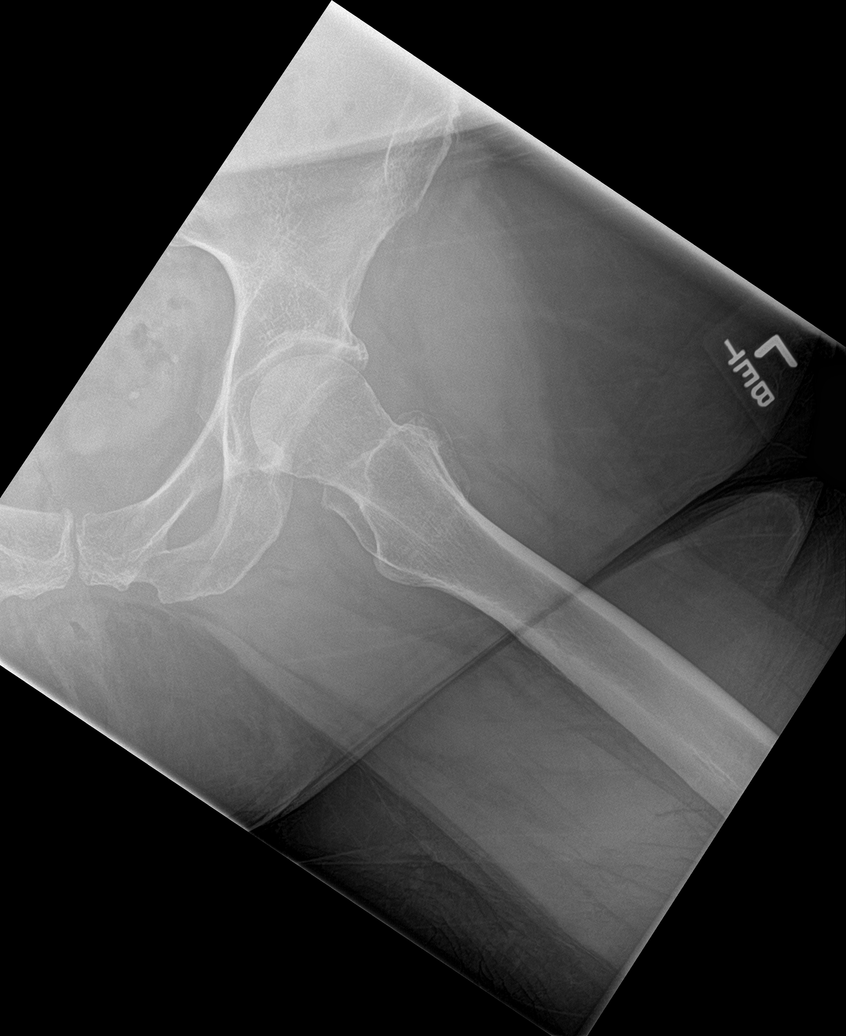

[5 of 5 positions shown; findings below may reference images not displayed]

FINDINGS: Pelvic ring is intact. Degenerative changes of the hip joints are
noted bilaterally. No acute fracture or dislocation is identified.
No soft tissue abnormality is seen.
IMPRESSION: Degenerative change of the hip joints without acute abnormality.

## 2018-01-11 DIAGNOSIS — E039 Hypothyroidism, unspecified: Secondary | ICD-10-CM | POA: Diagnosis not present

## 2018-01-29 DIAGNOSIS — M25561 Pain in right knee: Secondary | ICD-10-CM | POA: Diagnosis not present

## 2018-01-29 DIAGNOSIS — M25562 Pain in left knee: Secondary | ICD-10-CM | POA: Diagnosis not present

## 2018-01-29 DIAGNOSIS — M170 Bilateral primary osteoarthritis of knee: Secondary | ICD-10-CM | POA: Diagnosis not present

## 2018-02-14 DIAGNOSIS — Z6841 Body Mass Index (BMI) 40.0 and over, adult: Secondary | ICD-10-CM | POA: Diagnosis not present

## 2018-02-14 DIAGNOSIS — R252 Cramp and spasm: Secondary | ICD-10-CM | POA: Diagnosis not present

## 2018-02-14 DIAGNOSIS — R739 Hyperglycemia, unspecified: Secondary | ICD-10-CM | POA: Diagnosis not present

## 2018-02-22 DIAGNOSIS — E039 Hypothyroidism, unspecified: Secondary | ICD-10-CM | POA: Diagnosis not present

## 2018-03-06 DIAGNOSIS — R739 Hyperglycemia, unspecified: Secondary | ICD-10-CM | POA: Diagnosis not present

## 2018-03-06 DIAGNOSIS — Z1389 Encounter for screening for other disorder: Secondary | ICD-10-CM | POA: Diagnosis not present

## 2018-03-06 DIAGNOSIS — E039 Hypothyroidism, unspecified: Secondary | ICD-10-CM | POA: Diagnosis not present

## 2018-03-06 DIAGNOSIS — Z6841 Body Mass Index (BMI) 40.0 and over, adult: Secondary | ICD-10-CM | POA: Diagnosis not present

## 2018-03-06 DIAGNOSIS — R69 Illness, unspecified: Secondary | ICD-10-CM | POA: Diagnosis not present

## 2018-03-06 DIAGNOSIS — Z1331 Encounter for screening for depression: Secondary | ICD-10-CM | POA: Diagnosis not present

## 2018-03-15 DIAGNOSIS — R69 Illness, unspecified: Secondary | ICD-10-CM | POA: Diagnosis not present

## 2018-04-03 DIAGNOSIS — L820 Inflamed seborrheic keratosis: Secondary | ICD-10-CM | POA: Diagnosis not present

## 2018-04-03 DIAGNOSIS — D369 Benign neoplasm, unspecified site: Secondary | ICD-10-CM | POA: Diagnosis not present

## 2018-04-03 DIAGNOSIS — D2339 Other benign neoplasm of skin of other parts of face: Secondary | ICD-10-CM | POA: Diagnosis not present

## 2018-05-10 DIAGNOSIS — Z6841 Body Mass Index (BMI) 40.0 and over, adult: Secondary | ICD-10-CM | POA: Diagnosis not present

## 2018-05-10 DIAGNOSIS — J0100 Acute maxillary sinusitis, unspecified: Secondary | ICD-10-CM | POA: Diagnosis not present

## 2018-05-23 DIAGNOSIS — J019 Acute sinusitis, unspecified: Secondary | ICD-10-CM | POA: Diagnosis not present

## 2018-05-23 DIAGNOSIS — K219 Gastro-esophageal reflux disease without esophagitis: Secondary | ICD-10-CM | POA: Diagnosis not present

## 2018-05-23 DIAGNOSIS — Z6841 Body Mass Index (BMI) 40.0 and over, adult: Secondary | ICD-10-CM | POA: Diagnosis not present

## 2018-06-03 DIAGNOSIS — Z6841 Body Mass Index (BMI) 40.0 and over, adult: Secondary | ICD-10-CM | POA: Diagnosis not present

## 2018-06-03 DIAGNOSIS — Z0001 Encounter for general adult medical examination with abnormal findings: Secondary | ICD-10-CM | POA: Diagnosis not present

## 2018-06-18 ENCOUNTER — Encounter: Payer: Self-pay | Admitting: Internal Medicine

## 2018-07-04 DIAGNOSIS — E039 Hypothyroidism, unspecified: Secondary | ICD-10-CM | POA: Diagnosis not present

## 2018-07-04 DIAGNOSIS — L918 Other hypertrophic disorders of the skin: Secondary | ICD-10-CM | POA: Diagnosis not present

## 2018-07-04 DIAGNOSIS — Z6841 Body Mass Index (BMI) 40.0 and over, adult: Secondary | ICD-10-CM | POA: Diagnosis not present

## 2018-08-14 DIAGNOSIS — E039 Hypothyroidism, unspecified: Secondary | ICD-10-CM | POA: Diagnosis not present

## 2018-09-02 DIAGNOSIS — R739 Hyperglycemia, unspecified: Secondary | ICD-10-CM | POA: Diagnosis not present

## 2018-09-02 DIAGNOSIS — R69 Illness, unspecified: Secondary | ICD-10-CM | POA: Diagnosis not present

## 2018-09-02 DIAGNOSIS — Z6841 Body Mass Index (BMI) 40.0 and over, adult: Secondary | ICD-10-CM | POA: Diagnosis not present

## 2018-09-02 DIAGNOSIS — E039 Hypothyroidism, unspecified: Secondary | ICD-10-CM | POA: Diagnosis not present

## 2018-10-25 DIAGNOSIS — E039 Hypothyroidism, unspecified: Secondary | ICD-10-CM | POA: Diagnosis not present

## 2018-10-25 DIAGNOSIS — R5383 Other fatigue: Secondary | ICD-10-CM | POA: Diagnosis not present

## 2018-11-18 DIAGNOSIS — M62838 Other muscle spasm: Secondary | ICD-10-CM | POA: Diagnosis not present

## 2018-11-18 DIAGNOSIS — M545 Low back pain: Secondary | ICD-10-CM | POA: Diagnosis not present

## 2018-11-20 DIAGNOSIS — M47812 Spondylosis without myelopathy or radiculopathy, cervical region: Secondary | ICD-10-CM | POA: Diagnosis not present

## 2018-11-20 DIAGNOSIS — M546 Pain in thoracic spine: Secondary | ICD-10-CM | POA: Diagnosis not present

## 2018-11-20 DIAGNOSIS — M47816 Spondylosis without myelopathy or radiculopathy, lumbar region: Secondary | ICD-10-CM | POA: Diagnosis not present

## 2018-11-27 DIAGNOSIS — M546 Pain in thoracic spine: Secondary | ICD-10-CM | POA: Diagnosis not present

## 2018-11-27 DIAGNOSIS — M47816 Spondylosis without myelopathy or radiculopathy, lumbar region: Secondary | ICD-10-CM | POA: Diagnosis not present

## 2018-11-27 DIAGNOSIS — M47812 Spondylosis without myelopathy or radiculopathy, cervical region: Secondary | ICD-10-CM | POA: Diagnosis not present

## 2018-12-02 DIAGNOSIS — E039 Hypothyroidism, unspecified: Secondary | ICD-10-CM | POA: Diagnosis not present

## 2018-12-02 DIAGNOSIS — R739 Hyperglycemia, unspecified: Secondary | ICD-10-CM | POA: Diagnosis not present

## 2018-12-02 DIAGNOSIS — R69 Illness, unspecified: Secondary | ICD-10-CM | POA: Diagnosis not present

## 2018-12-02 DIAGNOSIS — K219 Gastro-esophageal reflux disease without esophagitis: Secondary | ICD-10-CM | POA: Diagnosis not present

## 2018-12-04 DIAGNOSIS — M546 Pain in thoracic spine: Secondary | ICD-10-CM | POA: Diagnosis not present

## 2018-12-04 DIAGNOSIS — M47812 Spondylosis without myelopathy or radiculopathy, cervical region: Secondary | ICD-10-CM | POA: Diagnosis not present

## 2018-12-04 DIAGNOSIS — M47816 Spondylosis without myelopathy or radiculopathy, lumbar region: Secondary | ICD-10-CM | POA: Diagnosis not present

## 2018-12-11 DIAGNOSIS — M47812 Spondylosis without myelopathy or radiculopathy, cervical region: Secondary | ICD-10-CM | POA: Diagnosis not present

## 2018-12-11 DIAGNOSIS — M546 Pain in thoracic spine: Secondary | ICD-10-CM | POA: Diagnosis not present

## 2018-12-11 DIAGNOSIS — M47816 Spondylosis without myelopathy or radiculopathy, lumbar region: Secondary | ICD-10-CM | POA: Diagnosis not present

## 2018-12-16 DIAGNOSIS — M79605 Pain in left leg: Secondary | ICD-10-CM | POA: Diagnosis not present

## 2018-12-16 DIAGNOSIS — R609 Edema, unspecified: Secondary | ICD-10-CM | POA: Diagnosis not present

## 2018-12-16 DIAGNOSIS — Z6841 Body Mass Index (BMI) 40.0 and over, adult: Secondary | ICD-10-CM | POA: Diagnosis not present

## 2018-12-17 DIAGNOSIS — R600 Localized edema: Secondary | ICD-10-CM | POA: Diagnosis not present

## 2018-12-18 DIAGNOSIS — J329 Chronic sinusitis, unspecified: Secondary | ICD-10-CM | POA: Diagnosis not present

## 2018-12-18 DIAGNOSIS — M47812 Spondylosis without myelopathy or radiculopathy, cervical region: Secondary | ICD-10-CM | POA: Diagnosis not present

## 2018-12-18 DIAGNOSIS — Z6841 Body Mass Index (BMI) 40.0 and over, adult: Secondary | ICD-10-CM | POA: Diagnosis not present

## 2018-12-18 DIAGNOSIS — M47816 Spondylosis without myelopathy or radiculopathy, lumbar region: Secondary | ICD-10-CM | POA: Diagnosis not present

## 2018-12-18 DIAGNOSIS — R609 Edema, unspecified: Secondary | ICD-10-CM | POA: Diagnosis not present

## 2018-12-18 DIAGNOSIS — M546 Pain in thoracic spine: Secondary | ICD-10-CM | POA: Diagnosis not present

## 2018-12-25 DIAGNOSIS — M47812 Spondylosis without myelopathy or radiculopathy, cervical region: Secondary | ICD-10-CM | POA: Diagnosis not present

## 2018-12-25 DIAGNOSIS — M546 Pain in thoracic spine: Secondary | ICD-10-CM | POA: Diagnosis not present

## 2018-12-25 DIAGNOSIS — M47816 Spondylosis without myelopathy or radiculopathy, lumbar region: Secondary | ICD-10-CM | POA: Diagnosis not present

## 2019-01-01 DIAGNOSIS — M47812 Spondylosis without myelopathy or radiculopathy, cervical region: Secondary | ICD-10-CM | POA: Diagnosis not present

## 2019-01-01 DIAGNOSIS — M546 Pain in thoracic spine: Secondary | ICD-10-CM | POA: Diagnosis not present

## 2019-01-01 DIAGNOSIS — M47816 Spondylosis without myelopathy or radiculopathy, lumbar region: Secondary | ICD-10-CM | POA: Diagnosis not present

## 2019-01-14 DIAGNOSIS — Z6841 Body Mass Index (BMI) 40.0 and over, adult: Secondary | ICD-10-CM | POA: Diagnosis not present

## 2019-01-14 DIAGNOSIS — S80869A Insect bite (nonvenomous), unspecified lower leg, initial encounter: Secondary | ICD-10-CM | POA: Diagnosis not present

## 2019-01-22 DIAGNOSIS — E039 Hypothyroidism, unspecified: Secondary | ICD-10-CM | POA: Diagnosis not present

## 2019-01-22 DIAGNOSIS — M7989 Other specified soft tissue disorders: Secondary | ICD-10-CM | POA: Diagnosis not present

## 2019-01-22 DIAGNOSIS — Z79899 Other long term (current) drug therapy: Secondary | ICD-10-CM | POA: Diagnosis not present

## 2019-01-22 DIAGNOSIS — R69 Illness, unspecified: Secondary | ICD-10-CM | POA: Diagnosis not present

## 2019-01-22 DIAGNOSIS — I10 Essential (primary) hypertension: Secondary | ICD-10-CM | POA: Diagnosis not present

## 2019-01-22 DIAGNOSIS — M79604 Pain in right leg: Secondary | ICD-10-CM | POA: Diagnosis not present

## 2019-02-06 DIAGNOSIS — Z6841 Body Mass Index (BMI) 40.0 and over, adult: Secondary | ICD-10-CM | POA: Diagnosis not present

## 2019-02-06 DIAGNOSIS — S80869A Insect bite (nonvenomous), unspecified lower leg, initial encounter: Secondary | ICD-10-CM | POA: Diagnosis not present

## 2019-02-20 DIAGNOSIS — M47812 Spondylosis without myelopathy or radiculopathy, cervical region: Secondary | ICD-10-CM | POA: Diagnosis not present

## 2019-02-20 DIAGNOSIS — M47816 Spondylosis without myelopathy or radiculopathy, lumbar region: Secondary | ICD-10-CM | POA: Diagnosis not present

## 2019-02-20 DIAGNOSIS — M546 Pain in thoracic spine: Secondary | ICD-10-CM | POA: Diagnosis not present

## 2019-02-27 DIAGNOSIS — M47812 Spondylosis without myelopathy or radiculopathy, cervical region: Secondary | ICD-10-CM | POA: Diagnosis not present

## 2019-02-27 DIAGNOSIS — M546 Pain in thoracic spine: Secondary | ICD-10-CM | POA: Diagnosis not present

## 2019-02-27 DIAGNOSIS — M47816 Spondylosis without myelopathy or radiculopathy, lumbar region: Secondary | ICD-10-CM | POA: Diagnosis not present

## 2019-03-04 DIAGNOSIS — Z6841 Body Mass Index (BMI) 40.0 and over, adult: Secondary | ICD-10-CM | POA: Diagnosis not present

## 2019-03-04 DIAGNOSIS — E039 Hypothyroidism, unspecified: Secondary | ICD-10-CM | POA: Diagnosis not present

## 2019-03-04 DIAGNOSIS — R739 Hyperglycemia, unspecified: Secondary | ICD-10-CM | POA: Diagnosis not present

## 2019-03-04 DIAGNOSIS — R69 Illness, unspecified: Secondary | ICD-10-CM | POA: Diagnosis not present

## 2019-03-04 DIAGNOSIS — K219 Gastro-esophageal reflux disease without esophagitis: Secondary | ICD-10-CM | POA: Diagnosis not present

## 2019-03-20 DIAGNOSIS — M546 Pain in thoracic spine: Secondary | ICD-10-CM | POA: Diagnosis not present

## 2019-03-20 DIAGNOSIS — M47816 Spondylosis without myelopathy or radiculopathy, lumbar region: Secondary | ICD-10-CM | POA: Diagnosis not present

## 2019-03-20 DIAGNOSIS — M47812 Spondylosis without myelopathy or radiculopathy, cervical region: Secondary | ICD-10-CM | POA: Diagnosis not present

## 2019-05-10 DIAGNOSIS — R0981 Nasal congestion: Secondary | ICD-10-CM | POA: Diagnosis not present

## 2019-05-10 DIAGNOSIS — J029 Acute pharyngitis, unspecified: Secondary | ICD-10-CM | POA: Diagnosis not present

## 2019-05-10 DIAGNOSIS — R432 Parageusia: Secondary | ICD-10-CM | POA: Diagnosis not present

## 2019-05-10 DIAGNOSIS — R430 Anosmia: Secondary | ICD-10-CM | POA: Diagnosis not present

## 2019-05-21 DIAGNOSIS — U071 COVID-19: Secondary | ICD-10-CM | POA: Diagnosis not present

## 2019-05-21 DIAGNOSIS — J019 Acute sinusitis, unspecified: Secondary | ICD-10-CM | POA: Diagnosis not present

## 2019-06-03 DIAGNOSIS — R739 Hyperglycemia, unspecified: Secondary | ICD-10-CM | POA: Diagnosis not present

## 2019-06-03 DIAGNOSIS — K219 Gastro-esophageal reflux disease without esophagitis: Secondary | ICD-10-CM | POA: Diagnosis not present

## 2019-06-03 DIAGNOSIS — R69 Illness, unspecified: Secondary | ICD-10-CM | POA: Diagnosis not present

## 2019-06-03 DIAGNOSIS — E039 Hypothyroidism, unspecified: Secondary | ICD-10-CM | POA: Diagnosis not present

## 2019-06-03 DIAGNOSIS — Z6841 Body Mass Index (BMI) 40.0 and over, adult: Secondary | ICD-10-CM | POA: Diagnosis not present

## 2019-06-12 DIAGNOSIS — J45901 Unspecified asthma with (acute) exacerbation: Secondary | ICD-10-CM | POA: Diagnosis not present

## 2019-07-10 DIAGNOSIS — M9901 Segmental and somatic dysfunction of cervical region: Secondary | ICD-10-CM | POA: Diagnosis not present

## 2019-07-10 DIAGNOSIS — M9903 Segmental and somatic dysfunction of lumbar region: Secondary | ICD-10-CM | POA: Diagnosis not present

## 2019-07-10 DIAGNOSIS — M9902 Segmental and somatic dysfunction of thoracic region: Secondary | ICD-10-CM | POA: Diagnosis not present

## 2019-07-10 DIAGNOSIS — S134XXA Sprain of ligaments of cervical spine, initial encounter: Secondary | ICD-10-CM | POA: Diagnosis not present

## 2019-07-10 DIAGNOSIS — S338XXA Sprain of other parts of lumbar spine and pelvis, initial encounter: Secondary | ICD-10-CM | POA: Diagnosis not present

## 2019-07-10 DIAGNOSIS — S233XXA Sprain of ligaments of thoracic spine, initial encounter: Secondary | ICD-10-CM | POA: Diagnosis not present

## 2019-07-11 DIAGNOSIS — J45901 Unspecified asthma with (acute) exacerbation: Secondary | ICD-10-CM | POA: Diagnosis not present

## 2019-07-24 DIAGNOSIS — S233XXA Sprain of ligaments of thoracic spine, initial encounter: Secondary | ICD-10-CM | POA: Diagnosis not present

## 2019-07-24 DIAGNOSIS — S338XXA Sprain of other parts of lumbar spine and pelvis, initial encounter: Secondary | ICD-10-CM | POA: Diagnosis not present

## 2019-07-24 DIAGNOSIS — S134XXA Sprain of ligaments of cervical spine, initial encounter: Secondary | ICD-10-CM | POA: Diagnosis not present

## 2019-07-24 DIAGNOSIS — M9903 Segmental and somatic dysfunction of lumbar region: Secondary | ICD-10-CM | POA: Diagnosis not present

## 2019-07-24 DIAGNOSIS — M9901 Segmental and somatic dysfunction of cervical region: Secondary | ICD-10-CM | POA: Diagnosis not present

## 2019-07-24 DIAGNOSIS — M9902 Segmental and somatic dysfunction of thoracic region: Secondary | ICD-10-CM | POA: Diagnosis not present

## 2019-07-25 DIAGNOSIS — E039 Hypothyroidism, unspecified: Secondary | ICD-10-CM | POA: Diagnosis not present

## 2019-07-25 DIAGNOSIS — R002 Palpitations: Secondary | ICD-10-CM | POA: Diagnosis not present

## 2019-07-25 DIAGNOSIS — R69 Illness, unspecified: Secondary | ICD-10-CM | POA: Diagnosis not present

## 2019-07-25 DIAGNOSIS — R739 Hyperglycemia, unspecified: Secondary | ICD-10-CM | POA: Diagnosis not present

## 2019-07-25 DIAGNOSIS — K219 Gastro-esophageal reflux disease without esophagitis: Secondary | ICD-10-CM | POA: Diagnosis not present

## 2019-07-25 DIAGNOSIS — Z6841 Body Mass Index (BMI) 40.0 and over, adult: Secondary | ICD-10-CM | POA: Diagnosis not present

## 2019-07-31 DIAGNOSIS — M9902 Segmental and somatic dysfunction of thoracic region: Secondary | ICD-10-CM | POA: Diagnosis not present

## 2019-07-31 DIAGNOSIS — S233XXA Sprain of ligaments of thoracic spine, initial encounter: Secondary | ICD-10-CM | POA: Diagnosis not present

## 2019-07-31 DIAGNOSIS — M9903 Segmental and somatic dysfunction of lumbar region: Secondary | ICD-10-CM | POA: Diagnosis not present

## 2019-07-31 DIAGNOSIS — S134XXA Sprain of ligaments of cervical spine, initial encounter: Secondary | ICD-10-CM | POA: Diagnosis not present

## 2019-07-31 DIAGNOSIS — S338XXA Sprain of other parts of lumbar spine and pelvis, initial encounter: Secondary | ICD-10-CM | POA: Diagnosis not present

## 2019-07-31 DIAGNOSIS — M9901 Segmental and somatic dysfunction of cervical region: Secondary | ICD-10-CM | POA: Diagnosis not present

## 2019-08-13 DIAGNOSIS — J45901 Unspecified asthma with (acute) exacerbation: Secondary | ICD-10-CM | POA: Diagnosis not present

## 2019-09-01 DIAGNOSIS — J019 Acute sinusitis, unspecified: Secondary | ICD-10-CM | POA: Diagnosis not present

## 2019-09-01 DIAGNOSIS — K219 Gastro-esophageal reflux disease without esophagitis: Secondary | ICD-10-CM | POA: Diagnosis not present

## 2019-09-01 DIAGNOSIS — E039 Hypothyroidism, unspecified: Secondary | ICD-10-CM | POA: Diagnosis not present

## 2019-09-01 DIAGNOSIS — Z0001 Encounter for general adult medical examination with abnormal findings: Secondary | ICD-10-CM | POA: Diagnosis not present

## 2019-09-01 DIAGNOSIS — R739 Hyperglycemia, unspecified: Secondary | ICD-10-CM | POA: Diagnosis not present

## 2019-09-01 DIAGNOSIS — D519 Vitamin B12 deficiency anemia, unspecified: Secondary | ICD-10-CM | POA: Diagnosis not present

## 2019-09-01 DIAGNOSIS — Z6841 Body Mass Index (BMI) 40.0 and over, adult: Secondary | ICD-10-CM | POA: Diagnosis not present

## 2019-09-01 DIAGNOSIS — R69 Illness, unspecified: Secondary | ICD-10-CM | POA: Diagnosis not present

## 2019-09-18 DIAGNOSIS — M9902 Segmental and somatic dysfunction of thoracic region: Secondary | ICD-10-CM | POA: Diagnosis not present

## 2019-09-18 DIAGNOSIS — S338XXA Sprain of other parts of lumbar spine and pelvis, initial encounter: Secondary | ICD-10-CM | POA: Diagnosis not present

## 2019-09-18 DIAGNOSIS — M9901 Segmental and somatic dysfunction of cervical region: Secondary | ICD-10-CM | POA: Diagnosis not present

## 2019-09-18 DIAGNOSIS — S233XXA Sprain of ligaments of thoracic spine, initial encounter: Secondary | ICD-10-CM | POA: Diagnosis not present

## 2019-09-18 DIAGNOSIS — S134XXA Sprain of ligaments of cervical spine, initial encounter: Secondary | ICD-10-CM | POA: Diagnosis not present

## 2019-09-18 DIAGNOSIS — M9903 Segmental and somatic dysfunction of lumbar region: Secondary | ICD-10-CM | POA: Diagnosis not present

## 2019-09-21 DIAGNOSIS — I10 Essential (primary) hypertension: Secondary | ICD-10-CM | POA: Diagnosis not present

## 2019-09-21 DIAGNOSIS — R69 Illness, unspecified: Secondary | ICD-10-CM | POA: Diagnosis not present

## 2019-09-21 DIAGNOSIS — K5720 Diverticulitis of large intestine with perforation and abscess without bleeding: Secondary | ICD-10-CM | POA: Diagnosis not present

## 2019-09-21 DIAGNOSIS — R319 Hematuria, unspecified: Secondary | ICD-10-CM | POA: Diagnosis not present

## 2019-09-21 DIAGNOSIS — K5792 Diverticulitis of intestine, part unspecified, without perforation or abscess without bleeding: Secondary | ICD-10-CM | POA: Diagnosis not present

## 2019-09-21 DIAGNOSIS — K5732 Diverticulitis of large intestine without perforation or abscess without bleeding: Secondary | ICD-10-CM | POA: Diagnosis not present

## 2019-09-21 DIAGNOSIS — Z8616 Personal history of COVID-19: Secondary | ICD-10-CM | POA: Diagnosis not present

## 2019-09-21 DIAGNOSIS — E039 Hypothyroidism, unspecified: Secondary | ICD-10-CM | POA: Diagnosis not present

## 2019-09-21 DIAGNOSIS — E876 Hypokalemia: Secondary | ICD-10-CM | POA: Diagnosis not present

## 2019-09-21 DIAGNOSIS — K5780 Diverticulitis of intestine, part unspecified, with perforation and abscess without bleeding: Secondary | ICD-10-CM | POA: Diagnosis not present

## 2019-09-21 DIAGNOSIS — L0391 Acute lymphangitis, unspecified: Secondary | ICD-10-CM | POA: Diagnosis not present

## 2019-09-21 DIAGNOSIS — Z20822 Contact with and (suspected) exposure to covid-19: Secondary | ICD-10-CM | POA: Diagnosis not present

## 2019-09-21 DIAGNOSIS — R109 Unspecified abdominal pain: Secondary | ICD-10-CM | POA: Diagnosis not present

## 2019-09-22 DIAGNOSIS — K5732 Diverticulitis of large intestine without perforation or abscess without bleeding: Secondary | ICD-10-CM | POA: Diagnosis not present

## 2019-09-22 DIAGNOSIS — R69 Illness, unspecified: Secondary | ICD-10-CM | POA: Diagnosis not present

## 2019-09-22 DIAGNOSIS — E039 Hypothyroidism, unspecified: Secondary | ICD-10-CM | POA: Diagnosis not present

## 2019-09-22 DIAGNOSIS — K5720 Diverticulitis of large intestine with perforation and abscess without bleeding: Secondary | ICD-10-CM | POA: Diagnosis not present

## 2019-09-23 DIAGNOSIS — K5732 Diverticulitis of large intestine without perforation or abscess without bleeding: Secondary | ICD-10-CM | POA: Diagnosis not present

## 2019-09-24 DIAGNOSIS — K5732 Diverticulitis of large intestine without perforation or abscess without bleeding: Secondary | ICD-10-CM | POA: Diagnosis not present

## 2019-09-29 ENCOUNTER — Encounter: Payer: Self-pay | Admitting: Internal Medicine

## 2019-10-13 DIAGNOSIS — M545 Low back pain: Secondary | ICD-10-CM | POA: Diagnosis not present

## 2019-10-13 DIAGNOSIS — Z6841 Body Mass Index (BMI) 40.0 and over, adult: Secondary | ICD-10-CM | POA: Diagnosis not present

## 2019-10-14 ENCOUNTER — Other Ambulatory Visit: Payer: Self-pay

## 2019-10-14 ENCOUNTER — Encounter: Payer: Self-pay | Admitting: Gastroenterology

## 2019-10-14 ENCOUNTER — Ambulatory Visit: Payer: Medicare HMO | Admitting: Gastroenterology

## 2019-10-14 DIAGNOSIS — Z6841 Body Mass Index (BMI) 40.0 and over, adult: Secondary | ICD-10-CM | POA: Diagnosis not present

## 2019-10-14 DIAGNOSIS — R933 Abnormal findings on diagnostic imaging of other parts of digestive tract: Secondary | ICD-10-CM | POA: Diagnosis not present

## 2019-10-14 DIAGNOSIS — K5732 Diverticulitis of large intestine without perforation or abscess without bleeding: Secondary | ICD-10-CM

## 2019-10-14 DIAGNOSIS — M545 Low back pain: Secondary | ICD-10-CM | POA: Diagnosis not present

## 2019-10-14 NOTE — Progress Notes (Signed)
Primary Care Physician:  Rosine Door  Primary Gastroenterologist:  Garfield Cornea, MD   Chief Complaint  Patient presents with  . Diverticulitis    diagnosed 3/21 and was given abx, finished rx. Doing okay now    HPI:  Whitney Reed is a 69 y.o. female here at the request of Clemmie Krill PA-C for further evaluation of diverticulitis and consideration of colonoscopy.   EGD and colonoscopy in January 2010: Prominent Schatzki ring status post dilation, 3 colon polyps (hyperplastic) removed from the rectum, left-sided diverticulosis.  Patient states she was having stomach issues for quite awhile (3 months) before seeking medical attention. Pain in the LLQ which was progressive.  Symptoms started shortly after she was diagnosed with Covid several months ago.  Pain described as intermittent, stabbing.  She had also been off of the steroids antibiotics since diagnosed with Covid.  She went to the ED for evaluation at [facility].  She had a CT abdomen pelvis without contrast on March 21st, 2021.  There was a segment of bowel wall thickening and pericolonic fat stranding in the mid descending left colon, there may be a tiny contained perforation.  No pericolonic abscess or distant free air.  Findings consistent with acute diverticulitis involving the mid descending colon with possible tiny contained perforation.  Patient was evaluated by general surgery during her hospitalization.  She was treated conservatively.  She was advised to follow-up with [place] for colonoscopy.  Labs from September 22, 2019: AST 14.5, ALT 11, total bilirubin 0 point, alk phos 66, total bilirubin 0.5, creatinine 0.53, white blood cells 7500, hemoglobin 12.1, platelets 285.  At this time she is doing much better.  Abdominal pain has essentially resolved.  She has completed antibiotics.  Bowel movements are regular.  No rectal bleeding.  No heartburn on omeprazole.  Appetite improved.  No nausea or vomiting,  dysphagia.  Current Outpatient Medications  Medication Sig Dispense Refill  . ALPRAZolam (XANAX) 1 MG tablet Take 1 mg by mouth 3 (three) times daily. As needed    . cyclobenzaprine (FLEXERIL) 10 MG tablet Take 10 mg by mouth 3 (three) times daily as needed for muscle spasms.    . DULoxetine (CYMBALTA) 60 MG capsule 60 mg daily.    Marland Kitchen omeprazole (PRILOSEC) 20 MG capsule Take 20 mg by mouth daily.    Marland Kitchen SYNTHROID 150 MCG tablet Take 150 mcg by mouth daily.     No current facility-administered medications for this visit.    Allergies as of 10/14/2019 - Reviewed 10/12/2008  Allergen Reaction Noted  . Erythromycin      Past Medical History:  Diagnosis Date  . Chronic back pain   . Constipation   . GERD (gastroesophageal reflux disease)   . Hypothyroidism     Past Surgical History:  Procedure Laterality Date  . ABDOMINAL HYSTERECTOMY    . CHOLECYSTECTOMY    . COLONOSCOPY    . ESOPHAGOGASTRODUODENOSCOPY    . PILONIDAL CYST EXCISION      Family History  Problem Relation Age of Onset  . Colon cancer Neg Hx     Social History   Socioeconomic History  . Marital status: Married    Spouse name: Not on file  . Number of children: Not on file  . Years of education: Not on file  . Highest education level: Not on file  Occupational History  . Not on file  Tobacco Use  . Smoking status: Never Smoker  . Smokeless tobacco: Never Used  Substance and Sexual Activity  . Alcohol use: Never  . Drug use: Never  . Sexual activity: Not on file  Other Topics Concern  . Not on file  Social History Narrative  . Not on file   Social Determinants of Health   Financial Resource Strain:   . Difficulty of Paying Living Expenses:   Food Insecurity:   . Worried About Charity fundraiser in the Last Year:   . Arboriculturist in the Last Year:   Transportation Needs:   . Film/video editor (Medical):   Marland Kitchen Lack of Transportation (Non-Medical):   Physical Activity:   . Days of  Exercise per Week:   . Minutes of Exercise per Session:   Stress:   . Feeling of Stress :   Social Connections:   . Frequency of Communication with Friends and Family:   . Frequency of Social Gatherings with Friends and Family:   . Attends Religious Services:   . Active Member of Clubs or Organizations:   . Attends Archivist Meetings:   Marland Kitchen Marital Status:   Intimate Partner Violence:   . Fear of Current or Ex-Partner:   . Emotionally Abused:   Marland Kitchen Physically Abused:   . Sexually Abused:       ROS:  General: Negative for anorexia, weight loss, fever, chills, fatigue, weakness. Eyes: Negative for vision changes.  ENT: Negative for hoarseness, difficulty swallowing , nasal congestion. CV: Negative for chest pain, angina, palpitations, dyspnea on exertion, peripheral edema.  Respiratory: Negative for dyspnea at rest, dyspnea on exertion, cough, sputum, wheezing.  GI: See history of present illness. GU:  Negative for dysuria, hematuria, urinary incontinence, urinary frequency, nocturnal urination.  MS: Negative for joint pain, low back pain.  Derm: Negative for rash or itching.  Neuro: Negative for weakness, abnormal sensation, seizure, frequent headaches, memory loss, confusion.  Psych: Negative for anxiety, depression, suicidal ideation, hallucinations.  Endo: Negative for unusual weight change.  Heme: Negative for bruising or bleeding. Allergy: Negative for rash or hives.    Physical Examination:  BP (!) 149/88   Pulse 86   Temp (!) 97.1 F (36.2 C) (Oral)   Ht '5\' 4"'$  (1.626 m)   Wt 257 lb 3.2 oz (116.7 kg)   BMI 44.15 kg/m    General: Well-nourished, well-developed in no acute distress.  Head: Normocephalic, atraumatic.   Eyes: Conjunctiva pink, no icterus. Mouth: masked Neck: Supple without thyromegaly, masses, or lymphadenopathy.  Lungs: Clear to auscultation bilaterally.  Heart: Regular rate and rhythm, no murmurs rubs or gallops.  Abdomen: Bowel sounds  are normal, nontender, nondistended, no hepatosplenomegaly or masses, no abdominal bruits or    hernia , no rebound or guarding.   Rectal: not performed Extremities: No lower extremity edema. No clubbing or deformities.  Neuro: Alert and oriented x 4 , grossly normal neurologically.  Skin: Warm and dry, no rash or jaundice.   Psych: Alert and cooperative, normal mood and affect.  Labs: See hpi  Imaging Studies: No results found.

## 2019-10-14 NOTE — Patient Instructions (Signed)
Colonoscopy as scheduled. See separate instructions.   Call if you have any recurrent abdominal pain that lasts more than 24 hours.   Consume high fiber diet to prevent diverticulitis. You can add fiber supplement such as Benefiber or FiberChoice ONCE daily per package instructions.    High-Fiber Diet Fiber, also called dietary fiber, is a type of carbohydrate that is found in fruits, vegetables, whole grains, and beans. A high-fiber diet can have many health benefits. Your health care provider may recommend a high-fiber diet to help:  Prevent constipation. Fiber can make your bowel movements more regular.  Lower your cholesterol.  Relieve the following conditions: ? Swelling of veins in the anus (hemorrhoids). ? Swelling and irritation (inflammation) of specific areas of the digestive tract (uncomplicated diverticulosis). ? A problem of the large intestine (colon) that sometimes causes pain and diarrhea (irritable bowel syndrome, IBS).  Prevent overeating as part of a weight-loss plan.  Prevent heart disease, type 2 diabetes, and certain cancers. What is my plan? The recommended daily fiber intake in grams (g) includes:  38 g for men age 37 or younger.  30 g for men over age 75.  85 g for women age 17 or younger.  21 g for women over age 100. You can get the recommended daily intake of dietary fiber by:  Eating a variety of fruits, vegetables, grains, and beans.  Taking a fiber supplement, if it is not possible to get enough fiber through your diet. What do I need to know about a high-fiber diet?  It is better to get fiber through food sources rather than from fiber supplements. There is not a lot of research about how effective supplements are.  Always check the fiber content on the nutrition facts label of any prepackaged food. Look for foods that contain 5 g of fiber or more per serving.  Talk with a diet and nutrition specialist (dietitian) if you have questions about  specific foods that are recommended or not recommended for your medical condition, especially if those foods are not listed below.  Gradually increase how much fiber you consume. If you increase your intake of dietary fiber too quickly, you may have bloating, cramping, or gas.  Drink plenty of water. Water helps you to digest fiber. What are tips for following this plan?  Eat a wide variety of high-fiber foods.  Make sure that half of the grains that you eat each day are whole grains.  Eat breads and cereals that are made with whole-grain flour instead of refined flour or white flour.  Eat brown rice, bulgur wheat, or millet instead of white rice.  Start the day with a breakfast that is high in fiber, such as a cereal that contains 5 g of fiber or more per serving.  Use beans in place of meat in soups, salads, and pasta dishes.  Eat high-fiber snacks, such as berries, raw vegetables, nuts, and popcorn.  Choose whole fruits and vegetables instead of processed forms like juice or sauce. What foods can I eat?  Fruits Berries. Pears. Apples. Oranges. Avocado. Prunes and raisins. Dried figs. Vegetables Sweet potatoes. Spinach. Kale. Artichokes. Cabbage. Broccoli. Cauliflower. Green peas. Carrots. Squash. Grains Whole-grain breads. Multigrain cereal. Oats and oatmeal. Brown rice. Barley. Bulgur wheat. [place]. Quinoa. Bran muffins. Popcorn. Rye wafer crackers. Meats and other proteins Navy, kidney, and pinto beans. Soybeans. Split peas. Lentils. Nuts and seeds. Dairy Fiber-fortified yogurt. Beverages Fiber-fortified soy milk. Fiber-fortified orange juice. Other foods Fiber bars. The items listed  above may not be a complete list of recommended foods and beverages. Contact a dietitian for more options. What foods are not recommended? Fruits Fruit juice. Cooked, strained fruit. Vegetables Fried potatoes. Canned vegetables. Well-cooked vegetables. Grains White bread. Pasta made with  refined flour. White rice. Meats and other proteins Fatty cuts of meat. Fried chicken or fried fish. Dairy Milk. Yogurt. Cream cheese. Sour cream. Fats and oils Butters. Beverages Soft drinks. Other foods Cakes and pastries. The items listed above may not be a complete list of foods and beverages to avoid. Contact a dietitian for more information. Summary  Fiber is a type of carbohydrate. It is found in fruits, vegetables, whole grains, and beans.  There are many health benefits of eating a high-fiber diet, such as preventing constipation, lowering blood cholesterol, helping with weight loss, and reducing your risk of heart disease, diabetes, and certain cancers.  Gradually increase your intake of fiber. Increasing too fast can result in cramping, bloating, and gas. Drink plenty of water while you increase your fiber.  The best sources of fiber include whole fruits and vegetables, whole grains, nuts, seeds, and beans. This information is not intended to replace advice given to you by your health care provider. Make sure you discuss any questions you have with your health care provider. Document Revised: 04/23/2017 Document Reviewed: 04/23/2017 Elsevier Patient Education  2020 [employer].

## 2019-10-16 DIAGNOSIS — M9901 Segmental and somatic dysfunction of cervical region: Secondary | ICD-10-CM | POA: Diagnosis not present

## 2019-10-16 DIAGNOSIS — S233XXA Sprain of ligaments of thoracic spine, initial encounter: Secondary | ICD-10-CM | POA: Diagnosis not present

## 2019-10-16 DIAGNOSIS — M9903 Segmental and somatic dysfunction of lumbar region: Secondary | ICD-10-CM | POA: Diagnosis not present

## 2019-10-16 DIAGNOSIS — M9902 Segmental and somatic dysfunction of thoracic region: Secondary | ICD-10-CM | POA: Diagnosis not present

## 2019-10-16 DIAGNOSIS — S134XXA Sprain of ligaments of cervical spine, initial encounter: Secondary | ICD-10-CM | POA: Diagnosis not present

## 2019-10-16 DIAGNOSIS — S338XXA Sprain of other parts of lumbar spine and pelvis, initial encounter: Secondary | ICD-10-CM | POA: Diagnosis not present

## 2019-10-19 ENCOUNTER — Encounter: Payer: Self-pay | Admitting: Gastroenterology

## 2019-10-19 NOTE — Assessment & Plan Note (Signed)
Pleasant 69 year old female presenting for consideration of colonoscopy.  Admission last month with acute diverticulitis with possible contained perforation involving the descending colon.  Patient is much improved at this time.  Her last colonoscopy was in January 2010.  Would advise colonoscopy at this time to evaluate abnormal colon findings, exclude underlying malignancy.  We will plan for deep sedation given polypharmacy.  I have discussed the risks, alternatives, benefits with regards to but not limited to the risk of reaction to medication, bleeding, infection, perforation and the patient is agreeable to proceed. Written consent to be obtained.  Patient is aware that she should notify us if she has recurrent abdominal pain lasting for more than 24 hours.

## 2019-10-22 DIAGNOSIS — R5383 Other fatigue: Secondary | ICD-10-CM | POA: Diagnosis not present

## 2019-10-22 DIAGNOSIS — E039 Hypothyroidism, unspecified: Secondary | ICD-10-CM | POA: Diagnosis not present

## 2019-10-23 DIAGNOSIS — M9902 Segmental and somatic dysfunction of thoracic region: Secondary | ICD-10-CM | POA: Diagnosis not present

## 2019-10-23 DIAGNOSIS — S233XXA Sprain of ligaments of thoracic spine, initial encounter: Secondary | ICD-10-CM | POA: Diagnosis not present

## 2019-10-23 DIAGNOSIS — S338XXA Sprain of other parts of lumbar spine and pelvis, initial encounter: Secondary | ICD-10-CM | POA: Diagnosis not present

## 2019-10-23 DIAGNOSIS — M9901 Segmental and somatic dysfunction of cervical region: Secondary | ICD-10-CM | POA: Diagnosis not present

## 2019-10-23 DIAGNOSIS — M9903 Segmental and somatic dysfunction of lumbar region: Secondary | ICD-10-CM | POA: Diagnosis not present

## 2019-10-23 DIAGNOSIS — S134XXA Sprain of ligaments of cervical spine, initial encounter: Secondary | ICD-10-CM | POA: Diagnosis not present

## 2019-10-24 DIAGNOSIS — D72829 Elevated white blood cell count, unspecified: Secondary | ICD-10-CM | POA: Diagnosis not present

## 2019-10-24 DIAGNOSIS — K5792 Diverticulitis of intestine, part unspecified, without perforation or abscess without bleeding: Secondary | ICD-10-CM | POA: Diagnosis not present

## 2019-11-25 NOTE — Patient Instructions (Signed)
Whitney Reed  11/25/2019     @PREFPERIOPPHARMACY @   Your procedure is scheduled on  12/02/2019 .  Report to [facility] at  [place].M.  Call this number if you have problems the morning of surgery:  [phone]   Remember:  Follow the diet and prep instructions given to you by Dr Roseanne Kaufman office.                     Take these medicines the morning of surgery with A SIP OF WATER  Xanax(if needed), prilosec, synthroid. Use your inhaler before you come.    Do not wear jewelry, make-up or nail polish.  Do not wear lotions, powders, or perfumes. Please wear deodorant and brush your teeth.  Do not shave 48 hours prior to surgery.  Men may shave face and neck.  Do not bring valuables to the hospital.  [facility] is not responsible for any belongings or valuables.  Contacts, dentures or bridgework may not be worn into surgery.  Leave your suitcase in the car.  After surgery it may be brought to your room.  For patients admitted to the hospital, discharge time will be determined by your treatment team.  Patients discharged the day of surgery will not be allowed to drive home.   Name and phone number of your driver:   family Special instructions:  DO NOT smoke the morning of your procedure.  Please read over the following fact sheets that you were given. Anesthesia Post-op Instructions and Care and Recovery After Surgery       Colonoscopy, Adult, Care After This sheet gives you information about how to care for yourself after your procedure. Your health care provider may also give you more specific instructions. If you have problems or questions, contact your health care provider. What can I expect after the procedure? After the procedure, it is common to have:  A small amount of blood in your stool for 24 hours after the procedure.  Some gas.  Mild cramping or bloating of your abdomen. Follow these instructions at home: Eating and drinking   Drink enough  fluid to keep your urine pale yellow.  Follow instructions from your health care provider about eating or drinking restrictions.  Resume your normal diet as instructed by your health care provider. Avoid heavy or fried foods that are hard to digest. Activity  Rest as told by your health care provider.  Avoid sitting for a long time without moving. Get up to take short walks every 1-2 hours. This is important to improve blood flow and breathing. Ask for help if you feel weak or unsteady.  Return to your normal activities as told by your health care provider. Ask your health care provider what activities are safe for you. Managing cramping and bloating   Try walking around when you have cramps or feel bloated.  Apply heat to your abdomen as told by your health care provider. Use the heat source that your health care provider recommends, such as a moist heat pack or a heating pad. ? Place a towel between your skin and the heat source. ? Leave the heat on for 20-30 minutes. ? Remove the heat if your skin turns bright red. This is especially important if you are unable to feel pain, heat, or cold. You may have a greater risk of getting burned. General instructions  For the first 24 hours after the procedure: ? Do not  drive or use machinery. ? Do not sign important documents. ? Do not drink alcohol. ? Do your regular daily activities at a slower pace than normal. ? Eat soft foods that are easy to digest.  Take over-the-counter and prescription medicines only as told by your health care provider.  Keep all follow-up visits as told by your health care provider. This is important. Contact a health care provider if:  You have blood in your stool 2-3 days after the procedure. Get help right away if you have:  More than a small spotting of blood in your stool.  Large blood clots in your stool.  Swelling of your abdomen.  Nausea or vomiting.  A fever.  Increasing pain in your  abdomen that is not relieved with medicine. Summary  After the procedure, it is common to have a small amount of blood in your stool. You may also have mild cramping and bloating of your abdomen.  For the first 24 hours after the procedure, do not drive or use machinery, sign important documents, or drink alcohol.  Get help right away if you have a lot of blood in your stool, nausea or vomiting, a fever, or increased pain in your abdomen. This information is not intended to replace advice given to you by your health care provider. Make sure you discuss any questions you have with your health care provider. Document Revised: 01/13/2019 Document Reviewed: 01/13/2019 Elsevier Patient Education  [employer] [facility] These instructions provide you with information about caring for yourself after your procedure. Your health care provider may also give you more specific instructions. Your treatment has been planned according to current medical practices, but problems sometimes occur. Call your health care provider if you have any problems or questions after your procedure. What can I expect after the procedure? After your procedure, you may:  Feel sleepy for several hours.  Feel clumsy and have poor balance for several hours.  Feel forgetful about what happened after the procedure.  Have poor judgment for several hours.  Feel nauseous or vomit.  Have a sore throat if you had a breathing tube during the procedure. Follow these instructions at home: For at least 24 hours after the procedure:      Have a responsible adult stay with you. It is important to have someone help care for you until you are awake and alert.  Rest as needed.  Do not: ? Participate in activities in which you could fall or become injured. ? Drive. ? Use heavy machinery. ? Drink alcohol. ? Take sleeping pills or medicines that cause drowsiness. ? Make important decisions or  sign legal documents. ? Take care of children on your own. Eating and drinking  Follow the diet that is recommended by your health care provider.  If you vomit, drink water, juice, or soup when you can drink without vomiting.  Make sure you have little or no nausea before eating solid foods. General instructions  Take over-the-counter and prescription medicines only as told by your health care provider.  If you have sleep apnea, surgery and certain medicines can increase your risk for breathing problems. Follow instructions from your health care provider about wearing your sleep device: ? Anytime you are sleeping, including during daytime naps. ? While taking prescription pain medicines, sleeping medicines, or medicines that make you drowsy.  If you smoke, do not smoke without supervision.  Keep all follow-up visits as told by your health care provider. This  is important. Contact a health care provider if:  You keep feeling nauseous or you keep vomiting.  You feel light-headed.  You develop a rash.  You have a fever. Get help right away if:  You have trouble breathing. Summary  For several hours after your procedure, you may feel sleepy and have poor judgment.  Have a responsible adult stay with you for at least 24 hours or until you are awake and alert. This information is not intended to replace advice given to you by your health care provider. Make sure you discuss any questions you have with your health care provider. Document Revised: 09/17/2017 Document Reviewed: 10/10/2015 Elsevier Patient Education  [place].

## 2019-11-28 ENCOUNTER — Other Ambulatory Visit (HOSPITAL_COMMUNITY)
Admission: RE | Admit: 2019-11-28 | Discharge: 2019-11-28 | Disposition: A | Discharge status: Home / Self Care | Payer: Medicare HMO | Source: Ambulatory Visit | Attending: Internal Medicine | Admitting: Internal Medicine

## 2019-11-28 ENCOUNTER — Encounter (HOSPITAL_COMMUNITY)
Admission: RE | Admit: 2019-11-28 | Discharge: 2019-11-28 | Disposition: A | Discharge status: Home / Self Care | Payer: Medicare HMO | Source: Ambulatory Visit | Attending: Internal Medicine | Admitting: Internal Medicine

## 2019-11-28 ENCOUNTER — Other Ambulatory Visit: Payer: Self-pay

## 2019-11-28 ENCOUNTER — Encounter (HOSPITAL_COMMUNITY): Payer: Self-pay

## 2019-11-28 DIAGNOSIS — Z20822 Contact with and (suspected) exposure to covid-19: Secondary | ICD-10-CM | POA: Insufficient documentation

## 2019-11-28 DIAGNOSIS — Z01812 Encounter for preprocedural laboratory examination: Secondary | ICD-10-CM | POA: Insufficient documentation

## 2019-11-28 NOTE — Progress Notes (Signed)
I called patient to see when she was coming in, She said she had to go to the store. I told her that was fine she could come after that. Patient said she would come on. Nothing further needed.

## 2019-11-29 LAB — SARS CORONAVIRUS 2 (TAT 6-24 HRS): SARS Coronavirus 2: NEGATIVE

## 2019-12-02 ENCOUNTER — Ambulatory Visit (HOSPITAL_COMMUNITY): Payer: Medicare HMO | Admitting: Anesthesiology

## 2019-12-02 ENCOUNTER — Ambulatory Visit (HOSPITAL_COMMUNITY)
Admission: RE | Admit: 2019-12-02 | Discharge: 2019-12-02 | Disposition: A | Discharge status: Home / Self Care | Payer: Medicare HMO | Attending: Internal Medicine | Admitting: Internal Medicine

## 2019-12-02 ENCOUNTER — Encounter (HOSPITAL_COMMUNITY)
Admission: RE | Disposition: A | Discharge status: Home / Self Care | Payer: Self-pay | Source: Home / Self Care | Attending: Internal Medicine

## 2019-12-02 DIAGNOSIS — R0602 Shortness of breath: Secondary | ICD-10-CM | POA: Diagnosis not present

## 2019-12-02 DIAGNOSIS — G8929 Other chronic pain: Secondary | ICD-10-CM | POA: Diagnosis not present

## 2019-12-02 DIAGNOSIS — K219 Gastro-esophageal reflux disease without esophagitis: Secondary | ICD-10-CM | POA: Insufficient documentation

## 2019-12-02 DIAGNOSIS — E039 Hypothyroidism, unspecified: Secondary | ICD-10-CM | POA: Insufficient documentation

## 2019-12-02 DIAGNOSIS — R69 Illness, unspecified: Secondary | ICD-10-CM | POA: Diagnosis not present

## 2019-12-02 DIAGNOSIS — Z881 Allergy status to other antibiotic agents status: Secondary | ICD-10-CM | POA: Insufficient documentation

## 2019-12-02 DIAGNOSIS — R933 Abnormal findings on diagnostic imaging of other parts of digestive tract: Secondary | ICD-10-CM | POA: Diagnosis not present

## 2019-12-02 DIAGNOSIS — Z79899 Other long term (current) drug therapy: Secondary | ICD-10-CM | POA: Insufficient documentation

## 2019-12-02 DIAGNOSIS — K5900 Constipation, unspecified: Secondary | ICD-10-CM | POA: Diagnosis not present

## 2019-12-02 DIAGNOSIS — Z9049 Acquired absence of other specified parts of digestive tract: Secondary | ICD-10-CM | POA: Insufficient documentation

## 2019-12-02 DIAGNOSIS — K640 First degree hemorrhoids: Secondary | ICD-10-CM | POA: Insufficient documentation

## 2019-12-02 DIAGNOSIS — Z90710 Acquired absence of both cervix and uterus: Secondary | ICD-10-CM | POA: Diagnosis not present

## 2019-12-02 DIAGNOSIS — F329 Major depressive disorder, single episode, unspecified: Secondary | ICD-10-CM | POA: Diagnosis not present

## 2019-12-02 DIAGNOSIS — M549 Dorsalgia, unspecified: Secondary | ICD-10-CM | POA: Insufficient documentation

## 2019-12-02 DIAGNOSIS — K5730 Diverticulosis of large intestine without perforation or abscess without bleeding: Secondary | ICD-10-CM | POA: Diagnosis not present

## 2019-12-02 DIAGNOSIS — R519 Headache, unspecified: Secondary | ICD-10-CM | POA: Insufficient documentation

## 2019-12-02 HISTORY — PX: COLONOSCOPY WITH PROPOFOL: SHX5780

## 2019-12-02 SURGERY — COLONOSCOPY WITH PROPOFOL
Anesthesia: Monitor Anesthesia Care

## 2019-12-02 MED ORDER — PROPOFOL 500 MG/50ML IV EMUL
INTRAVENOUS | Status: DC | PRN
  Administered 2019-12-02: 150 ug/kg/min via INTRAVENOUS

## 2019-12-02 MED ORDER — CHLORHEXIDINE GLUCONATE CLOTH 2 % EX PADS: 6.0000 | MEDICATED_PAD | Freq: Once | CUTANEOUS | Status: DC

## 2019-12-02 MED ORDER — LACTATED RINGERS IV SOLN: INTRAVENOUS | Status: DC | PRN

## 2019-12-02 MED ORDER — PROPOFOL 10 MG/ML IV BOLUS
INTRAVENOUS | Status: DC | PRN
  Administered 2019-12-02: 40 ug via INTRAVENOUS
  Administered 2019-12-02: 60 ug via INTRAVENOUS

## 2019-12-02 MED ORDER — STERILE WATER FOR IRRIGATION IR SOLN
Status: DC | PRN
  Administered 2019-12-02: 100 mL

## 2019-12-02 MED ORDER — LACTATED RINGERS IV SOLN: Freq: Once | INTRAVENOUS | Status: AC

## 2019-12-02 NOTE — Transfer of Care (Signed)
Immediate Anesthesia Transfer of Care Note  Patient: Whitney Reed  Procedure(s) Performed: COLONOSCOPY WITH PROPOFOL (N/A )  Patient Location: PACU  Anesthesia Type:General  Level of Consciousness: awake, alert  and oriented  Airway & Oxygen Therapy: Patient Spontanous Breathing  Post-op Assessment: Report given to RN, Post -op Vital signs reviewed and stable and Patient moving all extremities X 4  Post vital signs: Reviewed and stable  Last Vitals:  Vitals Value Taken Time  BP 99/74 12/02/19 1115  Temp    Pulse 99 12/02/19 1117  Resp 16 12/02/19 1117  SpO2 97 % 12/02/19 1117  Vitals shown include unvalidated device data.  Last Pain:  Vitals:   12/02/19 1110  TempSrc:   PainSc: 0-No pain      Patients Stated Pain Goal: 8 ([id number])  Complications: No apparent anesthesia complications

## 2019-12-02 NOTE — Anesthesia Preprocedure Evaluation (Signed)
Anesthesia Evaluation  Patient identified by MRN, date of birth, ID band Patient awake    Reviewed: Allergy & Precautions, NPO status , Patient's Chart, lab work & pertinent test results  History of Anesthesia Complications Negative for: history of anesthetic complications  Airway Mallampati: II  TM Distance: >3 FB Neck ROM: Full    Dental no notable dental hx. (+) Dental Advisory Given, Missing   Pulmonary shortness of breath and with exertion, pneumonia (covid pneumonia), resolved,    Pulmonary exam normal breath sounds clear to auscultation       Cardiovascular METS: 3 - Mets Normal cardiovascular exam Rhythm:Regular Rate:Normal     Neuro/Psych  Headaches, PSYCHIATRIC DISORDERS Depression    GI/Hepatic Neg liver ROS, GERD  Medicated and Controlled,  Endo/Other  Hypothyroidism Morbid obesity  Renal/GU negative Renal ROS     Musculoskeletal   Abdominal Normal abdominal exam  (+)   Peds  Hematology   Anesthesia Other Findings   Reproductive/Obstetrics                            Anesthesia Physical Anesthesia Plan  ASA: III  Anesthesia Plan:    Post-op Pain Management:    Induction: Intravenous  PONV Risk Score and Plan: 2 and TIVA  Airway Management Planned: Nasal Cannula, Natural Airway and Simple Face Mask  Additional Equipment:   Intra-op Plan:   Post-operative Plan:   Informed Consent: I have reviewed the patients History and Physical, chart, labs and discussed the procedure including the risks, benefits and alternatives for the proposed anesthesia with the patient or authorized representative who has indicated his/her understanding and acceptance.     Dental advisory given  Plan Discussed with: CRNA and Surgeon  Anesthesia Plan Comments:        Anesthesia Quick Evaluation

## 2019-12-02 NOTE — Anesthesia Postprocedure Evaluation (Signed)
Anesthesia Post Note  Patient: Whitney Reed  Procedure(s) Performed: COLONOSCOPY WITH PROPOFOL (N/A )  Patient location during evaluation: PACU Anesthesia Type: General Level of consciousness: awake and alert, awake and oriented Pain management: pain level controlled Vital Signs Assessment: post-procedure vital signs reviewed and stable Respiratory status: spontaneous breathing and respiratory function stable Cardiovascular status: blood pressure returned to baseline and stable Postop Assessment: no apparent nausea or vomiting Anesthetic complications: no     Last Vitals:  Vitals:   12/02/19 1152 12/02/19 1154  BP:  (!) 136/94  Pulse: 87   Resp: 18 18  Temp: 36.5 C   SpO2: 97%     Last Pain:  Vitals:   12/02/19 1154  TempSrc:   PainSc: 0-No pain                 Ader Fritze C Aarionna Germer

## 2019-12-02 NOTE — Progress Notes (Signed)
Please excuse Whitney Reed from Work today December 02, 2019 through December 03, 2019.  She can return to normal activity and work after 12:00noon on Wednesday December 03, 2019. 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

## 2019-12-02 NOTE — H&P (Signed)
@LOGO @   Primary Care Physician:  Rosalee Kaufman, PA-C Primary Gastroenterologist:  Dr. Gala Romney  Pre-Procedure History & Physical: HPI:  Whitney Reed is a 69 y.o. female here for diagnostic colonoscopy.  Abnormal segment of left colon on CT.  Treated for diverticulitis.  She is clinically improved.  Past Medical History:  Diagnosis Date   Chronic back pain    Constipation    GERD (gastroesophageal reflux disease)    Hypothyroidism     Past Surgical History:  Procedure Laterality Date   ABDOMINAL HYSTERECTOMY     CHOLECYSTECTOMY     COLONOSCOPY     ESOPHAGOGASTRODUODENOSCOPY     PILONIDAL CYST EXCISION      Prior to Admission medications   Medication Sig Start Date End Date Taking? Authorizing Provider  acetaminophen (TYLENOL) 500 MG tablet Take 1,000 mg by mouth 3 (three) times daily as needed (for pain.).   Yes [provider]  albuterol (VENTOLIN HFA) 108 (90 Base) MCG/ACT inhaler Inhale 1-2 puffs into the lungs every 6 (six) hours as needed for wheezing or shortness of breath.   Yes [provider]  ALPRAZolam Duanne Moron) 1 MG tablet Take 1 mg by mouth 3 (three) times daily as needed for anxiety.  10/06/19  Yes [provider]  Aspirin-Acetaminophen-Caffeine (GOODYS EXTRA STRENGTH PO) Take 1 packet by mouth daily as needed (pain.).   Yes [provider]  DULoxetine (CYMBALTA) 60 MG capsule Take 60 mg by mouth every evening.  08/27/19 11/20/20 Yes [provider]  ibuprofen (ADVIL) 200 MG tablet Take 800 mg by mouth 2 (two) times daily as needed (pain.).   Yes [provider]  omeprazole (PRILOSEC) 20 MG capsule Take 20 mg by mouth daily before breakfast.  09/22/19  Yes [provider]  Polyethyl Glycol-Propyl Glycol (LUBRICANT EYE DROPS) 0.4-0.3 % SOLN Place 1 drop into both eyes 3 (three) times daily as needed (dry/irritated eyes.).   Yes [provider]  SYNTHROID 150 MCG tablet Take 150 mcg by  mouth daily before breakfast.  10/10/19  Yes [provider]    Allergies as of 10/14/2019 - Reviewed 10/12/2008  Allergen Reaction Noted   Erythromycin      Family History  Problem Relation Age of Onset   Colon cancer Neg Hx     Social History   Socioeconomic History   Marital status: Married    Spouse name: Not on file   Number of children: Not on file   Years of education: Not on file   Highest education level: Not on file  Occupational History   Not on file  Tobacco Use   Smoking status: Never Smoker   Smokeless tobacco: Never Used  Substance and Sexual Activity   Alcohol use: Never   Drug use: Never   Sexual activity: Yes  Other Topics Concern   Not on file  Social History Narrative   Not on file   Social Determinants of Health   Financial Resource Strain:    Difficulty of Paying Living Expenses:   Food Insecurity:    Worried About Charity fundraiser in the Last Year:    Arboriculturist in the Last Year:   Transportation Needs:    Film/video editor (Medical):    Lack of Transportation (Non-Medical):   Physical Activity:    Days of Exercise per Week:    Minutes of Exercise per Session:   Stress:    Feeling of Stress :   Social Connections:  Frequency of Communication with Friends and Family:    Frequency of Social Gatherings with Friends and Family:    Attends Religious Services:    Active Member of Clubs or Organizations:    Attends Music therapist:    Marital Status:   Intimate Partner Violence:    Fear of Current or Ex-Partner:    Emotionally Abused:    Physically Abused:    Sexually Abused:     Review of Systems: See HPI, otherwise negative ROS  Physical Exam: BP (!) 134/96    Pulse 99    Temp 98.1 F (36.7 C) (Oral)    Resp 18    SpO2 97%  General:   Alert,  Well-developed, well-nourished, pleasant and cooperative in NAD Neck:  Supple; no masses or thyromegaly. No significant  cervical adenopathy. Lungs:  Clear throughout to auscultation.   No wheezes, crackles, or rhonchi. No acute distress. Heart:  Regular rate and rhythm; no murmurs, clicks, rubs,  or gallops. Abdomen: Non-distended, normal bowel sounds.  Soft and nontender without appreciable mass or hepatosplenomegaly.  Pulses:  Normal pulses noted. Extremities:  Without clubbing or edema.  Impression/Plan: 69 year old lady with abnormal colon on CT.  Here for diagnostic colonoscopy.  The risks, benefits, limitations, alternatives and imponderables have been reviewed with the patient. Questions have been answered. All parties are agreeable.      Notice: This dictation was prepared with Dragon dictation along with smaller phrase technology. Any transcriptional errors that result from this process are unintentional and may not be corrected upon review.

## 2019-12-02 NOTE — Op Note (Signed)
[facility] Patient Name: Whitney Reed Procedure Date: 12/02/2019 10:23 AM MRN: [id number] Date of Birth: 1951/06/05 Attending MD: Norvel Richards , MD CSN: [id number] Age: 69 Admit Type: Outpatient Procedure:                Colonoscopy Indications:              Abnormal CT of the GI tract Providers:                Norvel Richards, MD, Rosina Lowenstein, RN, Randa Spike, Technician Referring MD:              Medicines:                Propofol per Anesthesia Complications:            No immediate complications. Estimated Blood Loss:     Estimated blood loss: none. Procedure:                Pre-Anesthesia Assessment:                           - Prior to the procedure, a History and Physical                            was performed, and patient medications and                            allergies were reviewed. The patient's tolerance of                            previous anesthesia was also reviewed. The risks                            and benefits of the procedure and the sedation                            options and risks were discussed with the patient.                            All questions were answered, and informed consent                            was obtained. Prior Anticoagulants: The patient has                            taken no previous anticoagulant or antiplatelet                            agents. ASA Grade Assessment: III - A patient with                            severe systemic disease. After reviewing the risks  and benefits, the patient was deemed in                            satisfactory condition to undergo the procedure.                           After obtaining informed consent, the colonoscope                            was passed under direct vision. Throughout the                            procedure, the patient's blood pressure, pulse, and                            oxygen  saturations were monitored continuously. The                            [id number]) scope was introduced through                            the anus and advanced to the the cecum, identified                            by appendiceal orifice and ileocecal valve. The                            colonoscopy was performed without difficulty. The                            patient tolerated the procedure well. The quality                            of the bowel preparation was adequate. Scope In: 10:53:29 AM Scope Out: 11:09:50 AM Scope Withdrawal Time: 0 hours 7 minutes 12 seconds  Total Procedure Duration: 0 hours 16 minutes 21 seconds  Findings:      The perianal and digital rectal examinations were normal.      Scattered medium-mouthed diverticula were found in the entire colon.      Non-bleeding internal hemorrhoids were found during retroflexion. The       hemorrhoids were moderate, medium-sized and Grade I (internal       hemorrhoids that do not prolapse).      The exam was otherwise without abnormality on direct and retroflexion       views. Impression:               - Diverticulosis in the entire examined colon.                           - Non-bleeding internal hemorrhoids.                           - The examination was otherwise normal on direct  and retroflexion views.                           - No specimens collected. Moderate Sedation:      Moderate (conscious) sedation was personally administered by an       anesthesia professional. The following parameters were monitored: oxygen       saturation, heart rate, blood pressure, respiratory rate, EKG, adequacy       of pulmonary ventilation, and response to care. Recommendation:           - Patient has a contact number available for                            emergencies. The signs and symptoms of potential                            delayed complications were discussed with the                             patient. Return to normal activities tomorrow.                            Written discharge instructions were provided to the                            patient.                           - Resume previous diet.                           - No repeat colonoscopy due to current age (19                            years or older).                           - Return to GI clinic PRN. Procedure Code(s):        --- Professional ---                           [phone], Colonoscopy, flexible; diagnostic, including                            collection of specimen(s) by brushing or washing,                            when performed (separate procedure) Diagnosis Code(s):        --- Professional ---                           K64.0, First degree hemorrhoids                           K57.30, Diverticulosis of large intestine without  perforation or abscess without bleeding                           R93.3, Abnormal findings on diagnostic imaging of                            other parts of digestive tract CPT copyright 2019 American Medical Association. All rights reserved. The codes documented in this report are preliminary and upon coder review may  be revised to meet current compliance requirements. Cristopher Estimable. Bow Buntyn, MD Norvel Richards, MD 12/02/2019 11:18:57 AM This report has been signed electronically. Number of Addenda: 0

## 2019-12-02 NOTE — Discharge Instructions (Signed)
Monitored Anesthesia Care, Care After These instructions provide you with information about caring for yourself after your procedure. Your health care provider may also give you more specific instructions. Your treatment has been planned according to current medical practices, but problems sometimes occur. Call your health care provider if you have any problems or questions after your procedure. What can I expect after the procedure? After your procedure, you may:  Feel sleepy for several hours.  Feel clumsy and have poor balance for several hours.  Feel forgetful about what happened after the procedure.  Have poor judgment for several hours.  Feel nauseous or vomit.  Have a sore throat if you had a breathing tube during the procedure. Follow these instructions at home: For at least 24 hours after the procedure:      Have a responsible adult stay with you. It is important to have someone help care for you until you are awake and alert.  Rest as needed.  Do not: ? Participate in activities in which you could fall or become injured. ? Drive. ? Use heavy machinery. ? Drink alcohol. ? Take sleeping pills or medicines that cause drowsiness. ? Make important decisions or sign legal documents. ? Take care of children on your own. Eating and drinking  Follow the diet that is recommended by your health care provider.  If you vomit, drink water, juice, or soup when you can drink without vomiting.  Make sure you have little or no nausea before eating solid foods. General instructions  Take over-the-counter and prescription medicines only as told by your health care provider.  If you have sleep apnea, surgery and certain medicines can increase your risk for breathing problems. Follow instructions from your health care provider about wearing your sleep device: ? Anytime you are sleeping, including during daytime naps. ? While taking prescription pain medicines, sleeping medicines,  or medicines that make you drowsy.  If you smoke, do not smoke without supervision.  Keep all follow-up visits as told by your health care provider. This is important. Contact a health care provider if:  You keep feeling nauseous or you keep vomiting.  You feel light-headed.  You develop a rash.  You have a fever. Get help right away if:  You have trouble breathing. Summary  For several hours after your procedure, you may feel sleepy and have poor judgment.  Have a responsible adult stay with you for at least 24 hours or until you are awake and alert. This information is not intended to replace advice given to you by your health care provider. Make sure you discuss any questions you have with your health care provider. Document Revised: 09/17/2017 Document Reviewed: 10/10/2015 Elsevier Patient Education  [place].   Diverticulosis  Diverticulosis is a condition that develops when small pouches (diverticula) form in the wall of the large intestine (colon). The colon is where water is absorbed and stool (feces) is formed. The pouches form when the inside layer of the colon pushes through weak spots in the outer layers of the colon. You may have a few pouches or many of them. The pouches usually do not cause problems unless they become inflamed or infected. When this happens, the condition is called diverticulitis. What are the causes? The cause of this condition is not known. What increases the risk? The following factors may make you more likely to develop this condition:  Being older than age 62. Your risk for this condition increases with age. Diverticulosis is rare  among people younger than age 66. By age 20, many people have it.  Eating a low-fiber diet.  Having frequent constipation.  Being overweight.  Not getting enough exercise.  Smoking.  Taking over-the-counter pain medicines, like aspirin and ibuprofen.  Having a family history of  diverticulosis. What are the signs or symptoms? In most people, there are no symptoms of this condition. If you do have symptoms, they may include:  Bloating.  Cramps in the abdomen.  Constipation or diarrhea.  Pain in the lower left side of the abdomen. How is this diagnosed? Because diverticulosis usually has no symptoms, it is most often diagnosed during an exam for other colon problems. The condition may be diagnosed by:  Using a flexible scope to examine the colon (colonoscopy).  Taking an X-ray of the colon after dye has been put into the colon (barium enema).  Having a CT scan. How is this treated? You may not need treatment for this condition. Your health care provider may recommend treatment to prevent problems. You may need treatment if you have symptoms or if you previously had diverticulitis. Treatment may include:  Eating a high-fiber diet.  Taking a fiber supplement.  Taking a live bacteria supplement (probiotic).  Taking medicine to relax your colon. Follow these instructions at home: Medicines  Take over-the-counter and prescription medicines only as told by your health care provider.  If told by your health care provider, take a fiber supplement or probiotic. Constipation prevention Your condition may cause constipation. To prevent or treat constipation, you may need to:  Drink enough fluid to keep your urine pale yellow.  Take over-the-counter or prescription medicines.  Eat foods that are high in fiber, such as beans, whole grains, and fresh fruits and vegetables.  Limit foods that are high in fat and processed sugars, such as fried or sweet foods.  General instructions  Try not to strain when you have a bowel movement.  Keep all follow-up visits as told by your health care provider. This is important. Contact a health care provider if you:  Have pain in your abdomen.  Have bloating.  Have cramps.  Have not had a bowel movement in 3  days. Get help right away if:  Your pain gets worse.  Your bloating becomes very bad.  You have a fever or chills, and your symptoms suddenly get worse.  You vomit.  You have bowel movements that are bloody or black.  You have bleeding from your rectum. Summary  Diverticulosis is a condition that develops when small pouches (diverticula) form in the wall of the large intestine (colon).  You may have a few pouches or many of them.  This condition is most often diagnosed during an exam for other colon problems.  Treatment may include increasing the fiber in your diet, taking supplements, or taking medicines. This information is not intended to replace advice given to you by your health care provider. Make sure you discuss any questions you have with your health care provider. Document Revised: 01/16/2019 Document Reviewed: 01/16/2019 Elsevier Patient Education  [place].   Colonoscopy Discharge Instructions  Read the instructions outlined below and refer to this sheet in the next few weeks. These discharge instructions provide you with general information on caring for yourself after you leave the hospital. Your doctor may also give you specific instructions. While your treatment has been planned according to the most current medical practices available, unavoidable complications occasionally occur. If you have any problems or questions  after discharge, call Dr. Gala Romney at [phone]. ACTIVITY  You may resume your regular activity, but move at a slower pace for the next 24 hours.   Take frequent rest periods for the next 24 hours.   Walking will help get rid of the air and reduce the bloated feeling in your belly (abdomen).   No driving for 24 hours (because of the medicine (anesthesia) used during the test).    Do not sign any important legal documents or operate any machinery for 24 hours (because of the anesthesia used during the test).  NUTRITION  Drink plenty of  fluids.   You may resume your normal diet as instructed by your doctor.   Begin with a light meal and progress to your normal diet. Heavy or fried foods are harder to digest and may make you feel sick to your stomach (nauseated).   Avoid alcoholic beverages for 24 hours or as instructed.  MEDICATIONS  You may resume your normal medications unless your doctor tells you otherwise.  WHAT YOU CAN EXPECT TODAY  Some feelings of bloating in the abdomen.   Passage of more gas than usual.   Spotting of blood in your stool or on the toilet paper.  IF YOU HAD POLYPS REMOVED DURING THE COLONOSCOPY:  No aspirin products for 7 days or as instructed.   No alcohol for 7 days or as instructed.   Eat a soft diet for the next 24 hours.  FINDING OUT THE RESULTS OF YOUR TEST Not all test results are available during your visit. If your test results are not back during the visit, make an appointment with your caregiver to find out the results. Do not assume everything is normal if you have not heard from your caregiver or the medical facility. It is important for you to follow up on all of your test results.  SEEK IMMEDIATE MEDICAL ATTENTION IF:  You have more than a spotting of blood in your stool.   Your belly is swollen (abdominal distention).   You are nauseated or vomiting.   You have a temperature over 101.   You have abdominal pain or discomfort that is severe or gets worse throughout the day.   Diverticulosis information provided  I do not recommend a future colonoscopy unless new symptoms develop  At patient request, I called Tisha at [phone] and reviewed results       High-Fiber Diet Fiber, also called dietary fiber, is a type of carbohydrate that is found in fruits, vegetables, whole grains, and beans. A high-fiber diet can have many health benefits. Your health care provider may recommend a high-fiber diet to help:  Prevent constipation. Fiber can make your bowel  movements more regular.  Lower your cholesterol.  Relieve the following conditions: ? Swelling of veins in the anus (hemorrhoids). ? Swelling and irritation (inflammation) of specific areas of the digestive tract (uncomplicated diverticulosis). ? A problem of the large intestine (colon) that sometimes causes pain and diarrhea (irritable bowel syndrome, IBS).  Prevent overeating as part of a weight-loss plan.  Prevent heart disease, type 2 diabetes, and certain cancers. What is my plan? The recommended daily fiber intake in grams (g) includes:  38 g for men age 70 or younger.  30 g for men over age 32.  57 g for women age 60 or younger.  21 g for women over age 75. You can get the recommended daily intake of dietary fiber by:  Eating a variety of fruits, vegetables, grains,  and beans.  Taking a fiber supplement, if it is not possible to get enough fiber through your diet. What do I need to know about a high-fiber diet?  It is better to get fiber through food sources rather than from fiber supplements. There is not a lot of research about how effective supplements are.  Always check the fiber content on the nutrition facts label of any prepackaged food. Look for foods that contain 5 g of fiber or more per serving.  Talk with a diet and nutrition specialist (dietitian) if you have questions about specific foods that are recommended or not recommended for your medical condition, especially if those foods are not listed below.  Gradually increase how much fiber you consume. If you increase your intake of dietary fiber too quickly, you may have bloating, cramping, or gas.  Drink plenty of water. Water helps you to digest fiber. What are tips for following this plan?  Eat a wide variety of high-fiber foods.  Make sure that half of the grains that you eat each day are whole grains.  Eat breads and cereals that are made with whole-grain flour instead of refined flour or white  flour.  Eat brown rice, bulgur wheat, or millet instead of white rice.  Start the day with a breakfast that is high in fiber, such as a cereal that contains 5 g of fiber or more per serving.  Use beans in place of meat in soups, salads, and pasta dishes.  Eat high-fiber snacks, such as berries, raw vegetables, nuts, and popcorn.  Choose whole fruits and vegetables instead of processed forms like juice or sauce. What foods can I eat?  Fruits Berries. Pears. Apples. Oranges. Avocado. Prunes and raisins. Dried figs. Vegetables Sweet potatoes. Spinach. Kale. Artichokes. Cabbage. Broccoli. Cauliflower. Green peas. Carrots. Squash. Grains Whole-grain breads. Multigrain cereal. Oats and oatmeal. Brown rice. Barley. Bulgur wheat. [place]. Quinoa. Bran muffins. Popcorn. Rye wafer crackers. Meats and other proteins Navy, kidney, and pinto beans. Soybeans. Split peas. Lentils. Nuts and seeds. Dairy Fiber-fortified yogurt. Beverages Fiber-fortified soy milk. Fiber-fortified orange juice. Other foods Fiber bars. The items listed above may not be a complete list of recommended foods and beverages. Contact a dietitian for more options. What foods are not recommended? Fruits Fruit juice. Cooked, strained fruit. Vegetables Fried potatoes. Canned vegetables. Well-cooked vegetables. Grains White bread. Pasta made with refined flour. White rice. Meats and other proteins Fatty cuts of meat. Fried chicken or fried fish. Dairy Milk. Yogurt. Cream cheese. Sour cream. Fats and oils Butters. Beverages Soft drinks. Other foods Cakes and pastries. The items listed above may not be a complete list of foods and beverages to avoid. Contact a dietitian for more information. Summary  Fiber is a type of carbohydrate. It is found in fruits, vegetables, whole grains, and beans.  There are many health benefits of eating a high-fiber diet, such as preventing constipation, lowering blood cholesterol,  helping with weight loss, and reducing your risk of heart disease, diabetes, and certain cancers.  Gradually increase your intake of fiber. Increasing too fast can result in cramping, bloating, and gas. Drink plenty of water while you increase your fiber.  The best sources of fiber include whole fruits and vegetables, whole grains, nuts, seeds, and beans. This information is not intended to replace advice given to you by your health care provider. Make sure you discuss any questions you have with your health care provider. Document Revised: 04/23/2017 Document Reviewed: 04/23/2017 Elsevier Patient Education  [phone]  [employer].

## 2019-12-09 DIAGNOSIS — K219 Gastro-esophageal reflux disease without esophagitis: Secondary | ICD-10-CM | POA: Diagnosis not present

## 2019-12-09 DIAGNOSIS — E039 Hypothyroidism, unspecified: Secondary | ICD-10-CM | POA: Diagnosis not present

## 2019-12-09 DIAGNOSIS — Z6841 Body Mass Index (BMI) 40.0 and over, adult: Secondary | ICD-10-CM | POA: Diagnosis not present

## 2019-12-09 DIAGNOSIS — R69 Illness, unspecified: Secondary | ICD-10-CM | POA: Diagnosis not present

## 2019-12-09 DIAGNOSIS — R739 Hyperglycemia, unspecified: Secondary | ICD-10-CM | POA: Diagnosis not present

## 2019-12-22 DIAGNOSIS — Z23 Encounter for immunization: Secondary | ICD-10-CM | POA: Diagnosis not present

## 2020-01-09 DIAGNOSIS — Z6841 Body Mass Index (BMI) 40.0 and over, adult: Secondary | ICD-10-CM | POA: Diagnosis not present

## 2020-01-09 DIAGNOSIS — M545 Low back pain: Secondary | ICD-10-CM | POA: Diagnosis not present

## 2020-01-19 DIAGNOSIS — Z23 Encounter for immunization: Secondary | ICD-10-CM | POA: Diagnosis not present

## 2020-03-10 DIAGNOSIS — Z6841 Body Mass Index (BMI) 40.0 and over, adult: Secondary | ICD-10-CM | POA: Diagnosis not present

## 2020-03-10 DIAGNOSIS — R69 Illness, unspecified: Secondary | ICD-10-CM | POA: Diagnosis not present

## 2020-03-10 DIAGNOSIS — E039 Hypothyroidism, unspecified: Secondary | ICD-10-CM | POA: Diagnosis not present

## 2020-03-10 DIAGNOSIS — K219 Gastro-esophageal reflux disease without esophagitis: Secondary | ICD-10-CM | POA: Diagnosis not present

## 2020-03-10 DIAGNOSIS — R739 Hyperglycemia, unspecified: Secondary | ICD-10-CM | POA: Diagnosis not present

## 2020-03-16 DIAGNOSIS — R1011 Right upper quadrant pain: Secondary | ICD-10-CM | POA: Diagnosis not present

## 2020-03-16 DIAGNOSIS — Z9049 Acquired absence of other specified parts of digestive tract: Secondary | ICD-10-CM | POA: Diagnosis not present

## 2020-03-16 DIAGNOSIS — R109 Unspecified abdominal pain: Secondary | ICD-10-CM | POA: Diagnosis not present

## 2020-04-14 DIAGNOSIS — R69 Illness, unspecified: Secondary | ICD-10-CM | POA: Diagnosis not present

## 2020-04-19 DIAGNOSIS — F32A Depression, unspecified: Secondary | ICD-10-CM | POA: Diagnosis not present

## 2020-04-19 DIAGNOSIS — K219 Gastro-esophageal reflux disease without esophagitis: Secondary | ICD-10-CM | POA: Diagnosis not present

## 2020-04-19 DIAGNOSIS — Z6841 Body Mass Index (BMI) 40.0 and over, adult: Secondary | ICD-10-CM | POA: Diagnosis not present

## 2020-04-19 DIAGNOSIS — R69 Illness, unspecified: Secondary | ICD-10-CM | POA: Diagnosis not present

## 2020-04-19 DIAGNOSIS — E039 Hypothyroidism, unspecified: Secondary | ICD-10-CM | POA: Diagnosis not present

## 2020-04-19 DIAGNOSIS — R739 Hyperglycemia, unspecified: Secondary | ICD-10-CM | POA: Diagnosis not present

## 2020-04-22 DIAGNOSIS — J069 Acute upper respiratory infection, unspecified: Secondary | ICD-10-CM | POA: Diagnosis not present

## 2020-04-22 DIAGNOSIS — J029 Acute pharyngitis, unspecified: Secondary | ICD-10-CM | POA: Diagnosis not present

## 2020-04-22 DIAGNOSIS — Z20828 Contact with and (suspected) exposure to other viral communicable diseases: Secondary | ICD-10-CM | POA: Diagnosis not present

## 2020-05-29 DIAGNOSIS — Z6841 Body Mass Index (BMI) 40.0 and over, adult: Secondary | ICD-10-CM | POA: Diagnosis not present

## 2020-05-29 DIAGNOSIS — R1013 Epigastric pain: Secondary | ICD-10-CM | POA: Diagnosis not present

## 2020-05-29 DIAGNOSIS — J019 Acute sinusitis, unspecified: Secondary | ICD-10-CM | POA: Diagnosis not present

## 2020-06-01 ENCOUNTER — Encounter: Payer: Self-pay | Admitting: Internal Medicine

## 2020-06-01 DIAGNOSIS — E039 Hypothyroidism, unspecified: Secondary | ICD-10-CM | POA: Diagnosis not present

## 2020-06-07 DIAGNOSIS — Z6841 Body Mass Index (BMI) 40.0 and over, adult: Secondary | ICD-10-CM | POA: Diagnosis not present

## 2020-06-07 DIAGNOSIS — R69 Illness, unspecified: Secondary | ICD-10-CM | POA: Diagnosis not present

## 2020-06-07 DIAGNOSIS — E039 Hypothyroidism, unspecified: Secondary | ICD-10-CM | POA: Diagnosis not present

## 2020-06-07 DIAGNOSIS — K219 Gastro-esophageal reflux disease without esophagitis: Secondary | ICD-10-CM | POA: Diagnosis not present

## 2020-06-16 DIAGNOSIS — H52223 Regular astigmatism, bilateral: Secondary | ICD-10-CM | POA: Diagnosis not present

## 2020-06-16 DIAGNOSIS — H524 Presbyopia: Secondary | ICD-10-CM | POA: Diagnosis not present

## 2020-06-16 DIAGNOSIS — E119 Type 2 diabetes mellitus without complications: Secondary | ICD-10-CM | POA: Diagnosis not present

## 2020-06-16 DIAGNOSIS — H2513 Age-related nuclear cataract, bilateral: Secondary | ICD-10-CM | POA: Diagnosis not present

## 2020-06-16 DIAGNOSIS — H5203 Hypermetropia, bilateral: Secondary | ICD-10-CM | POA: Diagnosis not present

## 2020-06-21 DIAGNOSIS — M9901 Segmental and somatic dysfunction of cervical region: Secondary | ICD-10-CM | POA: Diagnosis not present

## 2020-06-21 DIAGNOSIS — S338XXA Sprain of other parts of lumbar spine and pelvis, initial encounter: Secondary | ICD-10-CM | POA: Diagnosis not present

## 2020-06-21 DIAGNOSIS — M9902 Segmental and somatic dysfunction of thoracic region: Secondary | ICD-10-CM | POA: Diagnosis not present

## 2020-06-21 DIAGNOSIS — S233XXA Sprain of ligaments of thoracic spine, initial encounter: Secondary | ICD-10-CM | POA: Diagnosis not present

## 2020-06-21 DIAGNOSIS — M9903 Segmental and somatic dysfunction of lumbar region: Secondary | ICD-10-CM | POA: Diagnosis not present

## 2020-06-21 DIAGNOSIS — S134XXA Sprain of ligaments of cervical spine, initial encounter: Secondary | ICD-10-CM | POA: Diagnosis not present

## 2020-06-23 DIAGNOSIS — S233XXA Sprain of ligaments of thoracic spine, initial encounter: Secondary | ICD-10-CM | POA: Diagnosis not present

## 2020-06-23 DIAGNOSIS — S134XXA Sprain of ligaments of cervical spine, initial encounter: Secondary | ICD-10-CM | POA: Diagnosis not present

## 2020-06-23 DIAGNOSIS — M9901 Segmental and somatic dysfunction of cervical region: Secondary | ICD-10-CM | POA: Diagnosis not present

## 2020-06-23 DIAGNOSIS — M9902 Segmental and somatic dysfunction of thoracic region: Secondary | ICD-10-CM | POA: Diagnosis not present

## 2020-06-23 DIAGNOSIS — M9903 Segmental and somatic dysfunction of lumbar region: Secondary | ICD-10-CM | POA: Diagnosis not present

## 2020-06-23 DIAGNOSIS — S338XXA Sprain of other parts of lumbar spine and pelvis, initial encounter: Secondary | ICD-10-CM | POA: Diagnosis not present

## 2020-06-28 DIAGNOSIS — S233XXA Sprain of ligaments of thoracic spine, initial encounter: Secondary | ICD-10-CM | POA: Diagnosis not present

## 2020-06-28 DIAGNOSIS — M9903 Segmental and somatic dysfunction of lumbar region: Secondary | ICD-10-CM | POA: Diagnosis not present

## 2020-06-28 DIAGNOSIS — M9901 Segmental and somatic dysfunction of cervical region: Secondary | ICD-10-CM | POA: Diagnosis not present

## 2020-06-28 DIAGNOSIS — S338XXA Sprain of other parts of lumbar spine and pelvis, initial encounter: Secondary | ICD-10-CM | POA: Diagnosis not present

## 2020-06-28 DIAGNOSIS — S134XXA Sprain of ligaments of cervical spine, initial encounter: Secondary | ICD-10-CM | POA: Diagnosis not present

## 2020-06-28 DIAGNOSIS — M9902 Segmental and somatic dysfunction of thoracic region: Secondary | ICD-10-CM | POA: Diagnosis not present

## 2020-06-29 ENCOUNTER — Other Ambulatory Visit: Payer: Self-pay

## 2020-06-29 ENCOUNTER — Ambulatory Visit: Payer: Medicare HMO | Admitting: Internal Medicine

## 2020-06-29 ENCOUNTER — Encounter: Payer: Self-pay | Admitting: Internal Medicine

## 2020-06-29 VITALS — BP 157/94 | HR 79 | Temp 98.2°F | Ht 64.0 in | Wt 264.4 lb

## 2020-06-29 DIAGNOSIS — K5732 Diverticulitis of large intestine without perforation or abscess without bleeding: Secondary | ICD-10-CM

## 2020-06-29 DIAGNOSIS — R1013 Epigastric pain: Secondary | ICD-10-CM

## 2020-06-29 DIAGNOSIS — K219 Gastro-esophageal reflux disease without esophagitis: Secondary | ICD-10-CM

## 2020-06-29 DIAGNOSIS — R69 Illness, unspecified: Secondary | ICD-10-CM | POA: Diagnosis not present

## 2020-06-29 NOTE — Patient Instructions (Signed)
Stop ibuprofen and headache powders  Continue Protonix 40 mg twice daily  Use tylenol for pain (limit to 2 grams daily)  Eventual HP stool testing  If pain in stomach doesn't go away, you will need an upper endoscopy  Begin Benefiber - 1 tablespoon daily x 3 weeks; then increase to 2 tablespoons daily thereafter  OV here in 4-6 weeks

## 2020-06-29 NOTE — Progress Notes (Signed)
Primary Care Physician:  Sheela Stack Primary Gastroenterologist:  Dr. Jena Gauss  Pre-Procedure History & Physical: HPI:  Whitney Reed is a 69 y.o. female [facility] to further evaluate a several week history of gnawing epigastric pain.  Pain comes and goes not necessarily affected by eating or having a bowel movement.  Typical reflux symptoms well controlled on Protonix 40 mg twice daily.  No dysphagia.  Patient is not had any melena or rectal bleeding.  Describes [place] 4-5 stools occurring once every 2 to 3 days.  Does not take a fiber supplement.   Earlier this year, patient presented with diverticulitis at [facility].  CT confirmed diverticulitis.  Colonoscopy performed by me in June of this year demonstrated pancolonic diverticulosis and nonbleeding hemorrhoids. Patient readily admits to taking at least 2 g of ibuprofen daily; often takes 3 g (15-200 mg tablets) in a single day.  In addition, she typically takes 2-3 Goody powders daily for various aches and pains including headache. She has a history of H. pylori treated previously.  Patient asked me if he can "come back".  Far as I can tell, there is been no documentation of eradication. She is gained 7 pounds since she was seen here previously. Labs today spring look good to me in terms of her LFTs.  She had a celiac screen (TTG IgA and total IgA -negative and normal, respectively.  She feels that she does better on a gluten-free diet.  She has gained 7 pounds since she was last here. Past Medical History:  Diagnosis Date  . Chronic back pain   . Constipation   . GERD (gastroesophageal reflux disease)   . Hypothyroidism     Past Surgical History:  Procedure Laterality Date  . ABDOMINAL HYSTERECTOMY    . CHOLECYSTECTOMY    . COLONOSCOPY    . COLONOSCOPY WITH PROPOFOL N/A 12/02/2019   Procedure: COLONOSCOPY WITH PROPOFOL;  Surgeon: Corbin Ade, MD;  Location: [facility];   Service: Endoscopy;  Laterality: N/A;  10:15am  . ESOPHAGOGASTRODUODENOSCOPY    . PILONIDAL CYST EXCISION      Prior to Admission medications   Medication Sig Start Date End Date Taking? Authorizing Provider  acetaminophen (TYLENOL) 500 MG tablet Take 1,000 mg by mouth 3 (three) times daily as needed (for pain.).   Yes [provider]  albuterol (VENTOLIN HFA) 108 (90 Base) MCG/ACT inhaler Inhale 1-2 puffs into the lungs every 6 (six) hours as needed for wheezing or shortness of breath.   Yes [provider]  ALPRAZolam Prudy Feeler) 1 MG tablet Take 1 mg by mouth 3 (three) times daily as needed for anxiety.  10/06/19  Yes [provider]  Aspirin-Acetaminophen-Caffeine (GOODYS EXTRA STRENGTH PO) Take 1 packet by mouth daily as needed (pain.).   Yes [provider]  DULoxetine (CYMBALTA) 60 MG capsule Take 90 mg by mouth every evening. 08/27/19 11/20/20 Yes [provider]  ibuprofen (ADVIL) 200 MG tablet Take 800 mg by mouth 2 (two) times daily as needed (pain.).   Yes [provider]  levothyroxine (SYNTHROID) 137 MCG tablet Take 137 mcg by mouth daily before breakfast. 10/10/19  Yes [provider]  pantoprazole (PROTONIX) 40 MG tablet Take 40 mg by mouth 2 (two) times daily.   Yes [provider]  Polyethyl Glycol-Propyl Glycol (LUBRICANT EYE DROPS) 0.4-0.3 % SOLN Place 1 drop into both eyes 3 (three) times daily as needed (dry/irritated eyes.).  Yes [provider]    Allergies as of 06/29/2020 - Review Complete 06/29/2020  Allergen Reaction Noted  . Azithromycin Rash 11/29/2017  . Erythromycin Rash     Family History  Problem Relation Age of Onset  . Colon cancer Neg Hx     Social History   Socioeconomic History  . Marital status: Married    Spouse name: Not on file  . Number of children: Not on file  . Years of education: Not on file  . Highest education level: Not on file  Occupational History  . Not  on file  Tobacco Use  . Smoking status: Never Smoker  . Smokeless tobacco: Never Used  Vaping Use  . Vaping Use: Never used  Substance and Sexual Activity  . Alcohol use: Never  . Drug use: Never  . Sexual activity: Yes  Other Topics Concern  . Not on file  Social History Narrative  . Not on file   Social Determinants of Health   Financial Resource Strain: Not on file  Food Insecurity: Not on file  Transportation Needs: Not on file  Physical Activity: Not on file  Stress: Not on file  Social Connections: Not on file  Intimate Partner Violence: Not on file    Review of Systems: See HPI, otherwise negative ROS  Physical Exam: BP (!) 157/94   Pulse 79   Temp 98.2 F (36.8 C)   Ht 5\' 4"  (1.626 m)   Wt 264 lb 6.4 oz (119.9 kg)   BMI 45.38 kg/m  General:   Alert,  well-nourished,  obese, pleasant and cooperative in NAD Neck:  Supple; no masses or thyromegaly. No significant cervical adenopathy. Lungs:  Clear throughout to auscultation.   No wheezes, crackles, or rhonchi. No acute distress. Heart:  Regular rate and rhythm; no murmurs, clicks, rubs,  or gallops. Abdomen: N obese.  Positive bowel sounds soft and nontender without appreciable mass organomegaly pulses:  Normal pulses noted. Extremities:  Without clubbing or edema.  Impression/Plan: 69 year old morbidly obese lady presenting with epigastric pain which falls into the nebulous realm of dyspepsia in the setting of overt NSAID abuse.  Her intake of ibuprofen /ASA is excessive and is out and out dangerous as discussed with her today.  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  I suspect this correlates with her recent abdominal pain  History of diverticulitis earlier this year-resolved; suboptimal bowel function.  She would likely benefit from fiber supplementation.  Gluten sensitivity observed by the patient.  Serological screen for celiac disease negative, however.  GERD well controlled on  twice daily PPI therapy.  Recommendations:  Stop ibuprofen and headache powders  Continue Protonix 40 mg twice daily  Use tylenol for pain (limit to 2 grams daily)  Eventual HP stool testing  If pain in stomach doesn't go away, she will need an upper endoscopy in the near future.  Begin Benefiber - 1 tablespoon daily x 3 weeks; then increase to 2 tablespoons daily thereafter  Okay to limit gluten if it makes patient feel better.  OV here in 4-6 weeks   Notice: This dictation was prepared with Dragon dictation along with smaller phrase technology. Any transcriptional errors that result from this process are unintentional and may not be corrected upon review.

## 2020-06-30 DIAGNOSIS — Z20828 Contact with and (suspected) exposure to other viral communicable diseases: Secondary | ICD-10-CM | POA: Diagnosis not present

## 2020-07-22 ENCOUNTER — Telehealth: Payer: Self-pay | Admitting: Internal Medicine

## 2020-07-22 ENCOUNTER — Other Ambulatory Visit: Payer: Self-pay

## 2020-07-22 DIAGNOSIS — A048 Other specified bacterial intestinal infections: Secondary | ICD-10-CM

## 2020-07-22 NOTE — Telephone Encounter (Signed)
Spoke with pt. Pt is going to d/c Protonix and will take Pepcid Complete bid as needed. Pt will complete h. Pylori testing at [facility] lab. Orders placed.

## 2020-07-22 NOTE — Telephone Encounter (Signed)
Make sure no more NSAIDS;  Can stop protonix x 2 weeks and than proceed with HP testing;  in the interim, can use pepsid complete  -  1 to tablets twice daily as needed.

## 2020-07-22 NOTE — Telephone Encounter (Signed)
Pt called to make fu ov per Dr Gala Romney it was to be in 4-6 weeks from last appointment. I offered her several appointments but she can't come unless it's after 3pm due to her work. She said her work would require her to use a PTO day to come to doctor and she doesn't want to do that. She is aware of OV on 3/2 at 330. She also wanted to talk with nurse about having abd pain and benefiber not helping. Please call (after 3pm) [phone]

## 2020-07-22 NOTE — Telephone Encounter (Signed)
Spoke with pt. Pt was seen 06/29/20. Pt continues to have abdominal pain. Pt d/c the benefiber due to feeling too full and bloated. Pt is taking Pantoprazole 40 mg bid with no changes in abdominal pain. Pt would like to complete studies for H. Pylori. Pt is aware that she would have to d/c her Pantoprazole 14 days prior to testing. Please advise.

## 2020-07-27 ENCOUNTER — Telehealth: Payer: Self-pay | Admitting: Internal Medicine

## 2020-07-27 NOTE — Telephone Encounter (Signed)
Spoke with pt. Pt is going to go to [place] lab when she has been off of her PPI x 2 weeks for the H pylori testing.

## 2020-07-27 NOTE — Telephone Encounter (Signed)
[phone] PATIENT NEEDS TO BE CALLED AFTER 3PM DUE TO WORK, BUT SHE HAS SEVERAL QUESTIONS ABOUT THE PROCEDURE SHE IS SUPPOSED TO GO TO THE LAB FOR

## 2020-07-28 NOTE — Progress Notes (Signed)
Spoke with pt on yesterday 07/27/2020. Pt is going to have testing complete when she will be off her PPI for 2 weeks. Marland Kitchen

## 2020-08-10 LAB — HELICOBACTER PYLORI  SPECIAL ANTIGEN
MICRO NUMBER:: 11504073
SPECIMEN QUALITY: ADEQUATE

## 2020-08-17 ENCOUNTER — Ambulatory Visit: Payer: Medicare HMO | Admitting: Internal Medicine

## 2020-09-01 ENCOUNTER — Ambulatory Visit: Payer: Medicare HMO | Admitting: Gastroenterology

## 2020-09-07 LAB — TSH: TSH: 2.78 (ref <=5.90)

## 2020-10-14 NOTE — Patient Instructions (Signed)
Luvern Mischke Lemelin  10/14/2020     @PREFPERIOPPHARMACY @   Your procedure is scheduled on  10/25/2020.   Report to [facility] at  0730  AM.     Call this number if you have problems the morning of surgery:  [phone]   Remember:  Do not eat or drink after midnight.                        Take these medicines the morning of surgery with A SIP OF WATER  Xanax (if needed), levothyroxine, protonix.     Please brush your teeth.  Do not wear jewelry, make-up or nail polish.  Do not wear lotions, powders, or perfumes, or deodorant.  Do not shave 48 hours prior to surgery.  Men may shave face and neck.  Do not bring valuables to the hospital.  [facility] is not responsible for any belongings or valuables.  Contacts, dentures or bridgework may not be worn into surgery.  Leave your suitcase in the car.  After surgery it may be brought to your room.  For patients admitted to the hospital, discharge time will be determined by your treatment team.  Patients discharged the day of surgery will not be allowed to drive home and must have someone with them for 24 hours.    Special instructions:  DO NOT smoke tobacco or vape for 24 hours before your procedure.  Please read over the following fact sheets that you were given. Anesthesia Post-op Instructions and Care and Recovery After Surgery      Cataract Surgery, Care After This sheet gives you information about how to care for yourself after your procedure. Your health care provider may also give you more specific instructions. If you have problems or questions, contact your health care provider. What can I expect after the procedure? After the procedure, it is common to have:  Itching.  Discomfort.  Fluid discharge.  Sensitivity to light and to touch.  Bruising in or around the eye.  Mild blurred vision. Follow these instructions at home: Eye care  Do not touch or rub your eyes.  Protect your eyes as  told by your health care provider. You may be told to wear a protective eye shield or sunglasses.  Do not put a contact lens into the affected eye or eyes until your health care provider approves.  Keep the area around your eye clean and dry: ? Avoid swimming. ? Do not allow water to hit you directly in the face while showering. ? Keep soap and shampoo out of your eyes.  Check your eye every day for signs of infection. Watch for: ? Redness, swelling, or pain. ? Fluid, blood, or pus. ? Warmth. ? A bad smell. ? Vision that is getting worse. ? Sensitivity that is getting worse.   Activity  Do not drive for 24 hours if you were given a sedative during your procedure.  Avoid strenuous activities, such as playing contact sports, for as long as told by your health care provider.  Do not drive or use heavy machinery until your health care provider approves.  Do not bend or lift heavy objects. Bending increases pressure in the eye. You can walk, climb stairs, and do light household chores.  Ask your health care provider when you can return to work. If you work in a dusty environment, you may be advised to wear protective eyewear for a period of time. General  instructions  Take or apply over-the-counter and prescription medicines only as told by your health care provider. This includes eye drops.  Keep all follow-up visits as told by your health care provider. This is important. Contact a health care provider if:  You have increased bruising around your eye.  You have pain that is not helped with medicine.  You have a fever.  You have redness, swelling, or pain in your eye.  You have fluid, blood, or pus coming from your incision.  Your vision gets worse.  Your sensitivity to light gets worse. Get help right away if:  You have sudden loss of vision.  You see flashes of light or spots (floaters).  You have severe eye pain.  You develop nausea or vomiting. Summary  After  your procedure, it is common to have itching, discomfort, bruising, fluid discharge, or sensitivity to light.  Follow instructions from your health care provider about caring for your eye after the procedure.  Do not rub your eye after the procedure. You may need to wear eye protection or sunglasses. Do not wear contact lenses. Keep the area around your eye clean and dry.  Avoid activities that require a lot of effort. These include playing sports and lifting heavy objects.  Contact a health care provider if you have increased bruising, pain that does not go away, or a fever. Get help right away if you suddenly lose your vision, see flashes of light or spots, or have severe pain in the eye. This information is not intended to replace advice given to you by your health care provider. Make sure you discuss any questions you have with your health care provider. Document Revised: 04/15/2019 Document Reviewed: 12/17/2017 Elsevier Patient Education  [place].

## 2020-10-19 ENCOUNTER — Encounter (HOSPITAL_COMMUNITY): Payer: Self-pay

## 2020-10-19 ENCOUNTER — Encounter (HOSPITAL_COMMUNITY)
Admission: RE | Admit: 2020-10-19 | Discharge: 2020-10-19 | Disposition: A | Discharge status: Home / Self Care | Payer: Medicare Other | Source: Ambulatory Visit | Attending: Ophthalmology | Admitting: Ophthalmology

## 2020-10-19 ENCOUNTER — Other Ambulatory Visit: Payer: Self-pay

## 2020-10-19 HISTORY — DX: Unspecified osteoarthritis, unspecified site: M19.90

## 2020-10-19 HISTORY — DX: Other specified postprocedural states: R11.2

## 2020-10-19 HISTORY — DX: Other specified postprocedural states: Z98.890

## 2020-10-19 HISTORY — DX: Anxiety disorder, unspecified: F41.9

## 2020-10-19 NOTE — H&P (Signed)
Surgical History & Physical  Patient Name: Whitney Reed DOB: 04-07-1951  Surgery: Cataract extraction with intraocular lens implant phacoemulsification; Right Eye  Surgeon: Baruch Goldmann MD Surgery Date:  10/25/2020 Pre-Op Date:  10/18/2020  HPI: A 38 Yr. old female patient is referred by Dr Hassell Done for cataracart eval. 1. 1. The patient complains of nighttime light - car headlights, street lamps etc. glare causing poor vision, which began 5 months ago. Pt states seeing double, blurry. Both eyes are affected. The episode is gradual. The condition's severity increased since last visit. Symptoms occur when the patient is driving and outside. This is negatively affecting the patient's quality of life. HPI Completed by Dr. Baruch Goldmann  Medical History: Dry Eyes Cataracts Anxiety, Diverticulitis, fatty liver Arthritis Diabetes LDL Thyroid Problems  Review of Systems Respiratory Asthma All recorded systems are negative except as noted above.  Social   Never smoked  Medication Alprazolam, Cefprozil, Duloxetine, Pantoprazole, Synthroid,   Sx/Procedures Gallbladder Removal, Hysterectomy, Lower back cyst removal,   Drug Allergies  Erythomycin base,   History & Physical: Heent:  Cataract, Right eye NECK: supple without bruits LUNGS: lungs clear to auscultation CV: regular rate and rhythm Abdomen: soft and non-tender  Impression & Plan: Assessment: 1.  COMBINED FORMS AGE RELATED CATARACT; Both Eyes (H25.813) 2.  BLEPHARITIS; Right Upper Lid, Right Lower Lid, Left Upper Lid, Left Lower Lid (H01.001, H01.002,H01.004,H01.005) 3.  Pinguecula; Both Eyes (H11.153) 4.  CONJUNCTIVOCHALASIS; Both Eyes (H11.823) 5.  ASTIGMATISM, REGULAR; Both Eyes (H52.223)  Plan: 1.  Cataract accounts for the patient's decreased vision. This visual impairment is not correctable with a tolerable change in glasses or contact lenses. Cataract surgery with an implantation of a new lens should  significantly improve the visual and functional status of the patient. Discussed all risks, benefits, alternatives, and potential complications. Discussed the procedures and recovery. Patient desires to have surgery. A-scan ordered and performed today for intra-ocular lens calculations. The surgery will be performed in order to improve vision for driving, reading, and for eye examinations. Recommend phacoemulsification with intra-ocular lens. Recommend Dextenza for post-operative pain and inflammation. Right Eye - worse and non-dominant - first. Dilates well - shugarcaine by protocol. Declines Toric 2.  regular lid cleaning. 3.  Observe; Artificial tears as needed for irritation. 4.  Asymptomatic. 5.  discussed toric - declines.

## 2020-10-22 ENCOUNTER — Other Ambulatory Visit (HOSPITAL_COMMUNITY)
Admission: RE | Admit: 2020-10-22 | Discharge: 2020-10-22 | Disposition: A | Discharge status: Home / Self Care | Payer: Medicare Other | Source: Ambulatory Visit | Attending: Ophthalmology | Admitting: Ophthalmology

## 2020-10-22 ENCOUNTER — Other Ambulatory Visit: Payer: Self-pay

## 2020-10-22 DIAGNOSIS — Z20822 Contact with and (suspected) exposure to covid-19: Secondary | ICD-10-CM | POA: Insufficient documentation

## 2020-10-22 DIAGNOSIS — Z01812 Encounter for preprocedural laboratory examination: Secondary | ICD-10-CM | POA: Diagnosis present

## 2020-10-23 LAB — SARS CORONAVIRUS 2 (TAT 6-24 HRS): SARS Coronavirus 2: NEGATIVE

## 2020-10-25 ENCOUNTER — Encounter (HOSPITAL_COMMUNITY): Payer: Self-pay | Admitting: Ophthalmology

## 2020-10-25 ENCOUNTER — Encounter (HOSPITAL_COMMUNITY)
Admission: RE | Disposition: A | Discharge status: Home / Self Care | Payer: Self-pay | Source: Home / Self Care | Attending: Ophthalmology

## 2020-10-25 ENCOUNTER — Ambulatory Visit (HOSPITAL_COMMUNITY): Payer: Medicare Other | Admitting: Anesthesiology

## 2020-10-25 ENCOUNTER — Ambulatory Visit (HOSPITAL_COMMUNITY)
Admission: RE | Admit: 2020-10-25 | Discharge: 2020-10-25 | Disposition: A | Discharge status: Home / Self Care | Payer: Medicare Other | Attending: Ophthalmology | Admitting: Ophthalmology

## 2020-10-25 DIAGNOSIS — H0100A Unspecified blepharitis right eye, upper and lower eyelids: Secondary | ICD-10-CM | POA: Diagnosis not present

## 2020-10-25 DIAGNOSIS — H0100B Unspecified blepharitis left eye, upper and lower eyelids: Secondary | ICD-10-CM | POA: Insufficient documentation

## 2020-10-25 DIAGNOSIS — H11153 Pinguecula, bilateral: Secondary | ICD-10-CM | POA: Insufficient documentation

## 2020-10-25 DIAGNOSIS — H11823 Conjunctivochalasis, bilateral: Secondary | ICD-10-CM | POA: Diagnosis not present

## 2020-10-25 DIAGNOSIS — H52223 Regular astigmatism, bilateral: Secondary | ICD-10-CM | POA: Insufficient documentation

## 2020-10-25 DIAGNOSIS — Z79899 Other long term (current) drug therapy: Secondary | ICD-10-CM | POA: Diagnosis not present

## 2020-10-25 DIAGNOSIS — Z79890 Hormone replacement therapy (postmenopausal): Secondary | ICD-10-CM | POA: Insufficient documentation

## 2020-10-25 DIAGNOSIS — E1136 Type 2 diabetes mellitus with diabetic cataract: Secondary | ICD-10-CM | POA: Diagnosis not present

## 2020-10-25 DIAGNOSIS — H25811 Combined forms of age-related cataract, right eye: Secondary | ICD-10-CM | POA: Diagnosis present

## 2020-10-25 DIAGNOSIS — Z881 Allergy status to other antibiotic agents status: Secondary | ICD-10-CM | POA: Diagnosis not present

## 2020-10-25 HISTORY — PX: CATARACT EXTRACTION W/PHACO: SHX586

## 2020-10-25 LAB — GLUCOSE, CAPILLARY: Glucose-Capillary: 109 mg/dL — ABNORMAL HIGH (ref 70–99)

## 2020-10-25 SURGERY — PHACOEMULSIFICATION, CATARACT, WITH IOL INSERTION
Anesthesia: Monitor Anesthesia Care | Site: Eye | Laterality: Right

## 2020-10-25 MED ORDER — EPINEPHRINE PF 1 MG/ML IJ SOLN
INTRAMUSCULAR | Status: AC
  Filled 2020-10-25: qty 2

## 2020-10-25 MED ORDER — PHENYLEPHRINE HCL 2.5 % OP SOLN
1.0000 [drp] | OPHTHALMIC | Status: AC | PRN
  Administered 2020-10-25 (×3): 1 [drp] via OPHTHALMIC

## 2020-10-25 MED ORDER — TROPICAMIDE 1 % OP SOLN
1.0000 [drp] | OPHTHALMIC | Status: AC
  Administered 2020-10-25 (×3): 1 [drp] via OPHTHALMIC

## 2020-10-25 MED ORDER — SODIUM HYALURONATE 23MG/ML IO SOSY
PREFILLED_SYRINGE | INTRAOCULAR | Status: DC | PRN
  Administered 2020-10-25: 0.6 mL via INTRAOCULAR

## 2020-10-25 MED ORDER — BSS IO SOLN
INTRAOCULAR | Status: DC | PRN
  Administered 2020-10-25: 15 mL

## 2020-10-25 MED ORDER — EPINEPHRINE PF 1 MG/ML IJ SOLN
INTRAOCULAR | Status: DC | PRN
  Administered 2020-10-25: 500 mL

## 2020-10-25 MED ORDER — NEOMYCIN-POLYMYXIN-DEXAMETH 3.5-10000-0.1 OP SUSP
OPHTHALMIC | Status: DC | PRN
  Administered 2020-10-25: 1 [drp] via OPHTHALMIC

## 2020-10-25 MED ORDER — LIDOCAINE HCL (PF) 1 % IJ SOLN
INTRAOCULAR | Status: DC | PRN
  Administered 2020-10-25: 1 mL via OPHTHALMIC

## 2020-10-25 MED ORDER — STERILE WATER FOR IRRIGATION IR SOLN
Status: DC | PRN
  Administered 2020-10-25: 250 mL

## 2020-10-25 MED ORDER — LIDOCAINE HCL 3.5 % OP GEL
1.0000 "application " | Freq: Once | OPHTHALMIC | Status: AC
  Administered 2020-10-25: 1 via OPHTHALMIC

## 2020-10-25 MED ORDER — POVIDONE-IODINE 5 % OP SOLN
OPHTHALMIC | Status: DC | PRN
  Administered 2020-10-25: 1 via OPHTHALMIC

## 2020-10-25 MED ORDER — SODIUM HYALURONATE 10 MG/ML IO SOLUTION
PREFILLED_SYRINGE | INTRAOCULAR | Status: DC | PRN
  Administered 2020-10-25: 0.85 mL via INTRAOCULAR

## 2020-10-25 MED ORDER — TETRACAINE HCL 0.5 % OP SOLN
1.0000 [drp] | OPHTHALMIC | Status: AC | PRN
  Administered 2020-10-25 (×3): 1 [drp] via OPHTHALMIC

## 2020-10-25 SURGICAL SUPPLY — 12 items
CLOTH BEACON ORANGE TIMEOUT ST (SAFETY) ×1 IMPLANT
EYE SHIELD UNIVERSAL CLEAR (GAUZE/BANDAGES/DRESSINGS) ×1 IMPLANT
GLOVE SURG UNDER POLY LF SZ7 (GLOVE) ×4 IMPLANT
NDL HYPO 18GX1.5 BLUNT FILL (NEEDLE) IMPLANT
NEEDLE HYPO 18GX1.5 BLUNT FILL (NEEDLE) ×2 IMPLANT
PAD ARMBOARD 7.5X6 YLW CONV (MISCELLANEOUS) ×1 IMPLANT
SYR TB 1ML LL NO SAFETY (SYRINGE) ×1 IMPLANT
TAPE SURG TRANSPORE 1 IN (GAUZE/BANDAGES/DRESSINGS) IMPLANT
TAPE SURGICAL TRANSPORE 1 IN (GAUZE/BANDAGES/DRESSINGS) ×2
TECNIS 1 PIECE IOL (Intraocular Lens) ×1 IMPLANT
VISCOELASTIC ADDITIONAL (OPHTHALMIC RELATED) IMPLANT
WATER STERILE IRR 250ML POUR (IV SOLUTION) ×2 IMPLANT

## 2020-10-25 NOTE — Anesthesia Preprocedure Evaluation (Signed)
Anesthesia Evaluation  Patient identified by MRN, date of birth, ID band Patient awake    Reviewed: Allergy & Precautions, NPO status , Patient's Chart, lab work & pertinent test results  History of Anesthesia Complications (+) PONV and history of anesthetic complications  Airway Mallampati: II  TM Distance: >3 FB Neck ROM: Full    Dental  (+) Dental Advisory Given, Missing, Poor Dentition   Pulmonary    Pulmonary exam normal breath sounds clear to auscultation       Cardiovascular Exercise Tolerance: Good Normal cardiovascular exam Rhythm:Regular Rate:Normal     Neuro/Psych  Headaches, PSYCHIATRIC DISORDERS Anxiety Depression    GI/Hepatic GERD  Medicated and Controlled,  Endo/Other  Hypothyroidism Morbid obesity  Renal/GU      Musculoskeletal  (+) Arthritis ,   Abdominal   Peds  Hematology   Anesthesia Other Findings   Reproductive/Obstetrics                             Anesthesia Physical Anesthesia Plan  ASA: III  Anesthesia Plan: MAC   Post-op Pain Management:    Induction:   PONV Risk Score and Plan:   Airway Management Planned: Nasal Cannula and Natural Airway  Additional Equipment:   Intra-op Plan:   Post-operative Plan:   Informed Consent: I have reviewed the patients History and Physical, chart, labs and discussed the procedure including the risks, benefits and alternatives for the proposed anesthesia with the patient or authorized representative who has indicated his/her understanding and acceptance.     Dental advisory given  Plan Discussed with: CRNA and Surgeon  Anesthesia Plan Comments:         Anesthesia Quick Evaluation

## 2020-10-25 NOTE — Anesthesia Postprocedure Evaluation (Signed)
Anesthesia Post Note  Patient: Whitney Reed  Procedure(s) Performed: CATARACT EXTRACTION PHACO AND INTRAOCULAR LENS PLACEMENT RIGHT EYE (Right Eye)  Patient location during evaluation: Short Stay Anesthesia Type: MAC Level of consciousness: awake and alert Pain management: pain level controlled Vital Signs Assessment: post-procedure vital signs reviewed and stable Respiratory status: spontaneous breathing Cardiovascular status: blood pressure returned to baseline and stable Postop Assessment: no apparent nausea or vomiting Anesthetic complications: no   No complications documented.   Last Vitals:  Vitals:   10/25/20 0829  Pulse: 70  Resp: (!) 21  Temp: 36.9 C  SpO2: 100%    Last Pain:  Vitals:   10/25/20 0829  TempSrc: Oral                 Cressida Milford

## 2020-10-25 NOTE — Transfer of Care (Signed)
Immediate Anesthesia Transfer of Care Note  Patient: Whitney Reed  Procedure(s) Performed: CATARACT EXTRACTION PHACO AND INTRAOCULAR LENS PLACEMENT RIGHT EYE (Right Eye)  Patient Location: Short Stay  Anesthesia Type:MAC  Level of Consciousness: awake  Airway & Oxygen Therapy: Patient Spontanous Breathing  Post-op Assessment: Report given to RN  Post vital signs: Reviewed  Last Vitals:  Vitals Value Taken Time  BP    Temp    Pulse    Resp    SpO2      Last Pain:  Vitals:   10/25/20 0829  TempSrc: Oral         Complications: No complications documented.

## 2020-10-25 NOTE — Discharge Instructions (Signed)
Please discharge patient when stable, will follow up today with Dr. Haila Dena at the [facility] office immediately following discharge.  Leave shield in place until visit.  All paperwork with discharge instructions will be given at the office.  [facility] Address:  [address]  [place], [place] [address]  

## 2020-10-25 NOTE — Op Note (Signed)
Date of procedure: 10/25/20  Pre-operative diagnosis:  Visually significant combined form age-related cataract, Right Eye (H25.811)  Post-operative diagnosis:  Visually significant combined form age-related cataract, Right Eye (H25.811)  Procedure: Removal of cataract via phacoemulsification and insertion of intra-ocular lens Wynetta Emery and [employer] DCB00  +22.5D into the capsular bag of the Right Eye  Attending surgeon: Gerda Diss. Maisen Schmit, MD, MA  Anesthesia: MAC, Topical Akten  Complications: None  Estimated Blood Loss: <45m (minimal)  Specimens: None  Implants: As above  Indications:  Visually significant age-related cataract, Right Eye  Procedure:  The patient was seen and identified in the pre-operative area. The operative eye was identified and dilated.  The operative eye was marked.  Topical anesthesia was administered to the operative eye.     The patient was then to the operative suite and placed in the supine position.  A timeout was performed confirming the patient, procedure to be performed, and all other relevant information.   The patient's face was prepped and draped in the usual fashion for intra-ocular surgery.  A lid speculum was placed into the operative eye and the surgical microscope moved into place and focused.  A superotemporal paracentesis was created using a 20 gauge paracentesis blade.  Shugarcaine was injected into the anterior chamber.  Viscoelastic was injected into the anterior chamber.  A temporal clear-corneal main wound incision was created using a 2.431mmicrokeratome.  A continuous curvilinear capsulorrhexis was initiated using an irrigating cystitome and completed using capsulorrhexis forceps.  Hydrodissection and hydrodeliniation were performed.  Viscoelastic was injected into the anterior chamber.  A phacoemulsification handpiece and a chopper as a second instrument were used to remove the nucleus and epinucleus. The irrigation/aspiration handpiece was  used to remove any remaining cortical material.   The capsular bag was reinflated with viscoelastic, checked, and found to be intact.  The intraocular lens was inserted into the capsular bag.  The irrigation/aspiration handpiece was used to remove any remaining viscoelastic.  The clear corneal wound and paracentesis wounds were then hydrated and checked with Weck-Cels to be watertight.  The lid-speculum was removed.  The drape was removed.  The patient's face was cleaned with a wet and dry 4x4.   Maxitrol was instilled in the eye. A clear shield was taped over the eye. The patient was taken to the post-operative care unit in good condition, having tolerated the procedure well.  Post-Op Instructions: [facility] and will receive all other orders and instructions.

## 2020-10-25 NOTE — Interval H&P Note (Signed)
History and Physical Interval Note:  10/25/2020 8:36 AM  Whitney Reed  has presented today for surgery, with the diagnosis of Nuclear sclerotic cataract - Right eye.  The various methods of treatment have been discussed with the patient and family. After consideration of risks, benefits and other options for treatment, the patient has consented to  Procedure(s) with comments: CATARACT EXTRACTION PHACO AND INTRAOCULAR LENS PLACEMENT RIGHT EYE (Right) - right as a surgical intervention.  The patient's history has been reviewed, patient examined, no change in status, stable for surgery.  I have reviewed the patient's chart and labs.  Questions were answered to the patient's satisfaction.     Baruch Goldmann

## 2020-10-27 ENCOUNTER — Encounter (HOSPITAL_COMMUNITY): Payer: Self-pay | Admitting: Ophthalmology

## 2020-11-01 ENCOUNTER — Ambulatory Visit: Payer: Medicare Other | Admitting: Gastroenterology

## 2020-11-02 NOTE — H&P (Signed)
Surgical History & Physical  Patient Name: Whitney Reed DOB: 03-Nov-1950  Surgery: Cataract extraction with intraocular lens implant phacoemulsification; Left Eye  Surgeon: Baruch Goldmann MD Surgery Date:  11/08/2020 Pre-Op Date:  11/01/2020  HPI: A 18 Yr. old female patient The patient is returning after cataract surgery. The right eye is affected. Status post cataract surgery, which began 1 weeks ago: Since the last visit, the affected area is doing well. The patient's vision is improved and stable. Patient is following medication instructions. Combo drops TID OD. Pt denies any increase in floaters HPI was performed by Baruch Goldmann .  Medical History: Dry Eyes Cataracts Anxiety, Diverticulitis, fatty liver Arthritis Diabetes LDL Thyroid Problems  Review of Systems Respiratory Asthma All recorded systems are negative except as noted above.  Social   Never smoked    Medication Prednisolone-gatiflox-bromfenac,  Alprazolam, Cefprozil, Duloxetine, Pantoprazole, Synthroid,   Sx/Procedures Phaco c IOL OD,  Gallbladder Removal, Hysterectomy, Lower back cyst removal,   Drug Allergies  Erythomycin base,   History & Physical: Heent:  Cataract, Left eye NECK: supple without bruits LUNGS: lungs clear to auscultation CV: regular rate and rhythm Abdomen: soft and non-tender  Impression & Plan: Assessment: 1.  CATARACT EXTRACTION STATUS; Right Eye (Z98.41) 2.  INTRAOCULAR LENS IOL (Z96.1) 3.  COMBINED FORMS AGE RELATED CATARACT; Left Eye (H25.812)  Plan: 1.  1 week after cataract surgery. Doing well with improved vision and normal eye pressure. Call with any problems or concerns. Continue Pred-Moxi-Brom 2x/day for 3 more weeks. 2.  Doing well since surgery Continue Post-op medications 3.  Cataract accounts for the patient's decreased vision. This visual impairment is not correctable with a tolerable change in glasses or contact lenses. Cataract surgery with an  implantation of a new lens should significantly improve the visual and functional status of the patient. Discussed all risks, benefits, alternatives, and potential complications. Discussed the procedures and recovery. Patient desires to have surgery. A-scan ordered and performed today for intra-ocular lens calculations. The surgery will be performed in order to improve vision for driving, reading, and for eye examinations. Recommend phacoemulsification with intra-ocular lens. Recommend Dextenza for post-operative pain and inflammation. Left Eye. Surgery required to correct imbalance of vision. Dilates well - shugarcaine by protocol. Declines toric.

## 2020-11-04 ENCOUNTER — Encounter (HOSPITAL_COMMUNITY)
Admit: 2020-11-04 | Discharge: 2020-11-04 | Disposition: A | Discharge status: Home / Self Care | Payer: Medicare Other | Attending: Ophthalmology | Admitting: Ophthalmology

## 2020-11-04 ENCOUNTER — Other Ambulatory Visit: Payer: Self-pay

## 2020-11-05 ENCOUNTER — Other Ambulatory Visit: Payer: Self-pay

## 2020-11-05 ENCOUNTER — Other Ambulatory Visit (HOSPITAL_COMMUNITY)
Admission: RE | Admit: 2020-11-05 | Discharge: 2020-11-05 | Disposition: A | Discharge status: Home / Self Care | Payer: Medicare Other | Source: Ambulatory Visit | Attending: Urology | Admitting: Urology

## 2020-11-05 DIAGNOSIS — Z01812 Encounter for preprocedural laboratory examination: Secondary | ICD-10-CM | POA: Insufficient documentation

## 2020-11-05 DIAGNOSIS — Z20822 Contact with and (suspected) exposure to covid-19: Secondary | ICD-10-CM | POA: Diagnosis not present

## 2020-11-05 NOTE — Pre-Procedure Instructions (Signed)
Have made multiple attempts to contact patient about surgery without [phone] without voicemail. Called daughter, Patric Dykes and left a message on her voicemail concerning preop instructions, [phone].

## 2020-11-06 LAB — SARS CORONAVIRUS 2 (TAT 6-24 HRS): SARS Coronavirus 2: NEGATIVE

## 2020-11-08 ENCOUNTER — Ambulatory Visit (HOSPITAL_COMMUNITY): Payer: Medicare Other | Admitting: Anesthesiology

## 2020-11-08 ENCOUNTER — Encounter (HOSPITAL_COMMUNITY)
Admission: RE | Disposition: A | Discharge status: Home / Self Care | Payer: Self-pay | Source: Home / Self Care | Attending: Ophthalmology

## 2020-11-08 ENCOUNTER — Ambulatory Visit (HOSPITAL_COMMUNITY)
Admission: RE | Admit: 2020-11-08 | Discharge: 2020-11-08 | Disposition: A | Discharge status: Home / Self Care | Payer: Medicare Other | Attending: Ophthalmology | Admitting: Ophthalmology

## 2020-11-08 ENCOUNTER — Encounter (HOSPITAL_COMMUNITY): Payer: Self-pay | Admitting: Ophthalmology

## 2020-11-08 DIAGNOSIS — E1136 Type 2 diabetes mellitus with diabetic cataract: Secondary | ICD-10-CM | POA: Diagnosis not present

## 2020-11-08 DIAGNOSIS — Z881 Allergy status to other antibiotic agents status: Secondary | ICD-10-CM | POA: Diagnosis not present

## 2020-11-08 DIAGNOSIS — H25812 Combined forms of age-related cataract, left eye: Secondary | ICD-10-CM | POA: Insufficient documentation

## 2020-11-08 DIAGNOSIS — Z79899 Other long term (current) drug therapy: Secondary | ICD-10-CM | POA: Insufficient documentation

## 2020-11-08 DIAGNOSIS — Z9841 Cataract extraction status, right eye: Secondary | ICD-10-CM | POA: Diagnosis not present

## 2020-11-08 DIAGNOSIS — Z792 Long term (current) use of antibiotics: Secondary | ICD-10-CM | POA: Diagnosis not present

## 2020-11-08 DIAGNOSIS — Z961 Presence of intraocular lens: Secondary | ICD-10-CM | POA: Insufficient documentation

## 2020-11-08 DIAGNOSIS — Z79890 Hormone replacement therapy (postmenopausal): Secondary | ICD-10-CM | POA: Insufficient documentation

## 2020-11-08 HISTORY — PX: CATARACT EXTRACTION W/PHACO: SHX586

## 2020-11-08 SURGERY — PHACOEMULSIFICATION, CATARACT, WITH IOL INSERTION
Anesthesia: Monitor Anesthesia Care | Site: Eye | Laterality: Left

## 2020-11-08 MED ORDER — SODIUM HYALURONATE 10 MG/ML IO SOLUTION
PREFILLED_SYRINGE | INTRAOCULAR | Status: DC | PRN
  Administered 2020-11-08: 0.85 mL via INTRAOCULAR

## 2020-11-08 MED ORDER — LIDOCAINE HCL 3.5 % OP GEL
1.0000 "application " | Freq: Once | OPHTHALMIC | Status: AC
  Administered 2020-11-08: 1 via OPHTHALMIC

## 2020-11-08 MED ORDER — NEOMYCIN-POLYMYXIN-DEXAMETH 3.5-10000-0.1 OP SUSP
OPHTHALMIC | Status: DC | PRN
  Administered 2020-11-08: 1 [drp] via OPHTHALMIC

## 2020-11-08 MED ORDER — LIDOCAINE HCL (PF) 1 % IJ SOLN
INTRAOCULAR | Status: DC | PRN
  Administered 2020-11-08: 1 mL via OPHTHALMIC

## 2020-11-08 MED ORDER — TETRACAINE HCL 0.5 % OP SOLN
1.0000 [drp] | OPHTHALMIC | Status: AC | PRN
  Administered 2020-11-08 (×3): 1 [drp] via OPHTHALMIC

## 2020-11-08 MED ORDER — TROPICAMIDE 1 % OP SOLN
1.0000 [drp] | OPHTHALMIC | Status: AC
  Administered 2020-11-08 (×3): 1 [drp] via OPHTHALMIC

## 2020-11-08 MED ORDER — EPINEPHRINE PF 1 MG/ML IJ SOLN
INTRAOCULAR | Status: DC | PRN
  Administered 2020-11-08: 500 mL

## 2020-11-08 MED ORDER — POVIDONE-IODINE 5 % OP SOLN
OPHTHALMIC | Status: DC | PRN
  Administered 2020-11-08: 1 via OPHTHALMIC

## 2020-11-08 MED ORDER — SODIUM HYALURONATE 23MG/ML IO SOSY
PREFILLED_SYRINGE | INTRAOCULAR | Status: DC | PRN
  Administered 2020-11-08: 0.6 mL via INTRAOCULAR

## 2020-11-08 MED ORDER — PHENYLEPHRINE HCL 2.5 % OP SOLN
1.0000 [drp] | OPHTHALMIC | Status: AC | PRN
  Administered 2020-11-08 (×3): 1 [drp] via OPHTHALMIC

## 2020-11-08 MED ORDER — STERILE WATER FOR IRRIGATION IR SOLN
Status: DC | PRN
  Administered 2020-11-08: 250 mL

## 2020-11-08 MED ORDER — BSS IO SOLN
INTRAOCULAR | Status: DC | PRN
  Administered 2020-11-08: 15 mL via INTRAOCULAR

## 2020-11-08 MED ORDER — EPINEPHRINE PF 1 MG/ML IJ SOLN
INTRAMUSCULAR | Status: AC
  Filled 2020-11-08: qty 2

## 2020-11-08 SURGICAL SUPPLY — 13 items
CLOTH BEACON ORANGE TIMEOUT ST (SAFETY) ×1 IMPLANT
EYE SHIELD UNIVERSAL CLEAR (GAUZE/BANDAGES/DRESSINGS) ×1 IMPLANT
GLOVE SURG UNDER POLY LF SZ6.5 (GLOVE) ×1 IMPLANT
GLOVE SURG UNDER POLY LF SZ7 (GLOVE) ×1 IMPLANT
NDL HYPO 18GX1.5 BLUNT FILL (NEEDLE) IMPLANT
NEEDLE HYPO 18GX1.5 BLUNT FILL (NEEDLE) ×2 IMPLANT
PAD ARMBOARD 7.5X6 YLW CONV (MISCELLANEOUS) ×1 IMPLANT
SYR TB 1ML LL NO SAFETY (SYRINGE) ×1 IMPLANT
TAPE SURG TRANSPORE 1 IN (GAUZE/BANDAGES/DRESSINGS) IMPLANT
TAPE SURGICAL TRANSPORE 1 IN (GAUZE/BANDAGES/DRESSINGS) ×2
TECNIS 1 PIECE IOL (Intraocular Lens) ×1 IMPLANT
VISCOELASTIC ADDITIONAL (OPHTHALMIC RELATED) IMPLANT
WATER STERILE IRR 250ML POUR (IV SOLUTION) ×1 IMPLANT

## 2020-11-08 NOTE — Anesthesia Preprocedure Evaluation (Signed)
Anesthesia Evaluation  Patient identified by MRN, date of birth, ID band Patient awake    Reviewed: Allergy & Precautions, NPO status , Patient's Chart, lab work & pertinent test results  History of Anesthesia Complications (+) PONV and history of anesthetic complications  Airway Mallampati: II  TM Distance: >3 FB Neck ROM: Full    Dental  (+) Dental Advisory Given, Missing, Chipped, Poor Dentition   Pulmonary    Pulmonary exam normal breath sounds clear to auscultation       Cardiovascular Exercise Tolerance: Good Normal cardiovascular exam Rhythm:Regular Rate:Normal     Neuro/Psych  Headaches, PSYCHIATRIC DISORDERS Anxiety Depression    GI/Hepatic GERD  Medicated and Controlled,  Endo/Other  Hypothyroidism Morbid obesity  Renal/GU      Musculoskeletal  (+) Arthritis , Osteoarthritis,    Abdominal   Peds  Hematology   Anesthesia Other Findings   Reproductive/Obstetrics                            Anesthesia Physical  Anesthesia Plan  ASA: III  Anesthesia Plan: MAC   Post-op Pain Management:    Induction:   PONV Risk Score and Plan:   Airway Management Planned: Nasal Cannula and Natural Airway  Additional Equipment:   Intra-op Plan:   Post-operative Plan:   Informed Consent: I have reviewed the patients History and Physical, chart, labs and discussed the procedure including the risks, benefits and alternatives for the proposed anesthesia with the patient or authorized representative who has indicated his/her understanding and acceptance.     Dental advisory given  Plan Discussed with: CRNA and Surgeon  Anesthesia Plan Comments:        Anesthesia Quick Evaluation

## 2020-11-08 NOTE — Anesthesia Postprocedure Evaluation (Signed)
Anesthesia Post Note  Patient: Whitney Reed  Procedure(s) Performed: CATARACT EXTRACTION PHACO AND INTRAOCULAR LENS PLACEMENT LEFT EYE (Left Eye)  Patient location during evaluation: Short Stay Anesthesia Type: MAC Level of consciousness: awake and alert and oriented Pain management: pain level controlled Vital Signs Assessment: post-procedure vital signs reviewed and stable Respiratory status: spontaneous breathing Cardiovascular status: blood pressure returned to baseline and stable Postop Assessment: no apparent nausea or vomiting Anesthetic complications: no   No complications documented.   Last Vitals:  Vitals:   11/08/20 1123 11/08/20 1248  BP: (!) 153/101 (!) 151/81  Pulse: 82 74  Resp: 14 16  Temp: 36.7 C 36.9 C  SpO2: 97% 96%    Last Pain:  Vitals:   11/08/20 1248  TempSrc: Oral  PainSc: 0-No pain                 [name]

## 2020-11-08 NOTE — Interval H&P Note (Signed)
History and Physical Interval Note:  11/08/2020 11:56 AM  Whitney Reed  has presented today for surgery, with the diagnosis of Nuclear sclerotic cataract - Left eye.  The various methods of treatment have been discussed with the patient and family. After consideration of risks, benefits and other options for treatment, the patient has consented to  Procedure(s) with comments: CATARACT EXTRACTION PHACO AND INTRAOCULAR LENS PLACEMENT LEFT EYE (Left) - left as a surgical intervention.  The patient's history has been reviewed, patient examined, no change in status, stable for surgery.  I have reviewed the patient's chart and labs.  Questions were answered to the patient's satisfaction.     Baruch Goldmann

## 2020-11-08 NOTE — Discharge Instructions (Signed)
Please discharge patient when stable, will follow up today with Dr. Josmar Messimer at the [facility] [place] office immediately following discharge.  Leave shield in place until visit.  All paperwork with discharge instructions will be given at the office.  [facility] Address:  [address]  [place], [place] [address]  

## 2020-11-08 NOTE — Transfer of Care (Signed)
Immediate Anesthesia Transfer of Care Note  Patient: Whitney Reed  Procedure(s) Performed: CATARACT EXTRACTION PHACO AND INTRAOCULAR LENS PLACEMENT LEFT EYE (Left Eye)  Patient Location: Short Stay  Anesthesia Type:MAC  Level of Consciousness: awake  Airway & Oxygen Therapy: Patient Spontanous Breathing  Post-op Assessment: Report given to RN  Post vital signs: Reviewed and stable  Last Vitals:  Vitals Value Taken Time  BP    Temp    Pulse    Resp    SpO2      Last Pain:  Vitals:   11/08/20 1123  TempSrc: Oral  PainSc: 0-No pain      Patients Stated Pain Goal: 5 (79/03/83 3383)  Complications: No complications documented.

## 2020-11-08 NOTE — Op Note (Signed)
Date of procedure: 11/08/20  Pre-operative diagnosis: Visually significant age-related combined cataract, Left Eye (H25.812)  Post-operative diagnosis: Visually significant age-related combined cataract, Left Eye (H25.812)  Procedure: Removal of cataract via phacoemulsification and insertion of intra-ocular lens [place]  +23.0D into the capsular bag of the Left Eye  Attending surgeon: Gerda Diss. Layken Beg, MD, MA  Anesthesia: MAC, Topical Akten  Complications: None  Estimated Blood Loss: <57m (minimal)  Specimens: None  Implants: As above  Indications:  Visually significant age-related cataract, Left Eye  Procedure:  The patient was seen and identified in the pre-operative area. The operative eye was identified and dilated.  The operative eye was marked.  Topical anesthesia was administered to the operative eye.     The patient was then to the operative suite and placed in the supine position.  A timeout was performed confirming the patient, procedure to be performed, and all other relevant information.   The patient's face was prepped and draped in the usual fashion for intra-ocular surgery.  A lid speculum was placed into the operative eye and the surgical microscope moved into place and focused.  An inferotemporal paracentesis was created using a 20 gauge paracentesis blade.  Shugarcaine was injected into the anterior chamber.  Viscoelastic was injected into the anterior chamber.  A temporal clear-corneal main wound incision was created using a 2.477mmicrokeratome.  A continuous curvilinear capsulorrhexis was initiated using an irrigating cystitome and completed using capsulorrhexis forceps.  Hydrodissection and hydrodeliniation were performed.  Viscoelastic was injected into the anterior chamber.  A phacoemulsification handpiece and a chopper as a second instrument were used to remove the nucleus and epinucleus. The irrigation/aspiration handpiece was used to remove  any remaining cortical material.   The capsular bag was reinflated with viscoelastic, checked, and found to be intact.  The intraocular lens was inserted into the capsular bag.  The irrigation/aspiration handpiece was used to remove any remaining viscoelastic.  The clear corneal wound and paracentesis wounds were then hydrated and checked with Weck-Cels to be watertight.  The lid-speculum was removed.  The drape was removed.  The patient's face was cleaned with a wet and dry 4x4.   Maxitrol was instilled in the eye. A clear shield was taped over the eye. The patient was taken to the post-operative care unit in good condition, having tolerated the procedure well.  Post-Op Instructions: [facility] and will receive all other orders and instructions.

## 2020-11-09 ENCOUNTER — Encounter (HOSPITAL_COMMUNITY): Payer: Self-pay | Admitting: Ophthalmology

## 2020-11-22 LAB — TSH: TSH: 65.6 — AB (ref 0.41–5.90)

## 2020-12-22 ENCOUNTER — Encounter: Payer: Self-pay | Admitting: Nurse Practitioner

## 2020-12-22 ENCOUNTER — Other Ambulatory Visit: Payer: Self-pay

## 2020-12-22 ENCOUNTER — Ambulatory Visit: Payer: Medicare Other | Admitting: Nurse Practitioner

## 2020-12-22 VITALS — BP 168/96 | HR 69 | Ht 64.0 in | Wt 259.0 lb

## 2020-12-22 DIAGNOSIS — E063 Autoimmune thyroiditis: Secondary | ICD-10-CM

## 2020-12-22 DIAGNOSIS — E038 Other specified hypothyroidism: Secondary | ICD-10-CM | POA: Diagnosis not present

## 2020-12-22 NOTE — Patient Instructions (Signed)

## 2020-12-22 NOTE — Progress Notes (Signed)
Endocrinology Consult Note                                         12/22/2020, 9:30 AM  Subjective:   Subjective    Whitney Reed is a 70 y.o.-year-old female patient being seen in consultation for hypothyroidism referred by Rosalee Kaufman, PA-C.   Past Medical History:  Diagnosis Date   Anxiety    Arthritis    osteo   Chronic back pain    Constipation    GERD (gastroesophageal reflux disease)    Hypothyroidism    PONV (postoperative nausea and vomiting)     Past Surgical History:  Procedure Laterality Date   ABDOMINAL HYSTERECTOMY     CATARACT EXTRACTION W/PHACO Right 10/25/2020   Procedure: CATARACT EXTRACTION PHACO AND INTRAOCULAR LENS PLACEMENT RIGHT EYE;  Surgeon: Baruch Goldmann, MD;  Location: [facility];  Service: Ophthalmology;  Laterality: Right;  right CDE=4.59   CATARACT EXTRACTION W/PHACO Left 11/08/2020   Procedure: CATARACT EXTRACTION PHACO AND INTRAOCULAR LENS PLACEMENT LEFT EYE;  Surgeon: Baruch Goldmann, MD;  Location: [facility];  Service: Ophthalmology;  Laterality: Left;  left CDE=5.37   CHOLECYSTECTOMY     COLONOSCOPY     COLONOSCOPY WITH PROPOFOL N/A 12/02/2019   Procedure: COLONOSCOPY WITH PROPOFOL;  Surgeon: Daneil Dolin, MD;  Location: [facility] SUITE;  Service: Endoscopy;  Laterality: N/A;  10:15am   ESOPHAGOGASTRODUODENOSCOPY     PILONIDAL CYST EXCISION     TONSILLECTOMY      Social History   Socioeconomic History   Marital status: Married    Spouse name: Not on file   Number of children: Not on file   Years of education: Not on file   Highest education level: Not on file  Occupational History   Not on file  Tobacco Use   Smoking status: Never   Smokeless tobacco: Never  Vaping Use   Vaping Use: Never used  Substance and Sexual Activity   Alcohol use: Never   Drug use: Never   Sexual activity: Yes  Other Topics Concern   Not on file  Social History Narrative    Not on file   Social Determinants of Health   Financial Resource Strain: Not on file  Food Insecurity: Not on file  Transportation Needs: Not on file  Physical Activity: Not on file  Stress: Not on file  Social Connections: Not on file    Family History  Problem Relation Age of Onset   Colon cancer Neg Hx     Outpatient Encounter Medications as of 12/22/2020  Medication Sig   acetaminophen (TYLENOL) 500 MG tablet Take 1,000 mg by mouth 3 (three) times daily as needed for moderate pain.   albuterol (VENTOLIN HFA) 108 (90 Base) MCG/ACT inhaler Inhale 1-2 puffs into the lungs every 6 (six) hours as needed for wheezing or shortness of breath.   ALPRAZolam (XANAX) 1 MG tablet Take 1 mg by mouth 3 (three) times daily as needed for anxiety.    DULoxetine (  CYMBALTA) 30 MG capsule Take 30 mg by mouth 3 (three) times daily.   levothyroxine (SYNTHROID) 137 MCG tablet Take 137 mcg by mouth daily before breakfast.   pantoprazole (PROTONIX) 40 MG tablet Take 40 mg by mouth 2 (two) times daily.   Polyethyl Glycol-Propyl Glycol (LUBRICANT EYE DROPS) 0.4-0.3 % SOLN Place 1 drop into both eyes 3 (three) times daily as needed (dry/irritated eyes.).   [DISCONTINUED] DULoxetine (CYMBALTA) 30 MG capsule Take 90 mg by mouth in the morning, at noon, and at bedtime.   No facility-administered encounter medications on file as of 12/22/2020.    ALLERGIES: Allergies  Allergen Reactions   Erythromycin Rash   VACCINATION STATUS:  There is no immunization history on file for this patient.   HPI  Thyroid Problem Presents for initial visit. Symptoms include fatigue, hair loss, hoarse voice and weight loss. Patient reports no palpitations or tremors. The symptoms have been worsening. Past treatments include levothyroxine. The treatment provided mild relief. Her past medical history is significant for obesity. There are no known risk factors.   Whitney Reed  is a patient with the above medical history.  she was diagnosed with Hashimoto's hypothyroidism at approximate age of 53 years, which required subsequent initiation of thyroid hormone replacement. she was given various doses of Levothyroxine over the years, currently on 137 micrograms. she reports compliance to this medication: However, she reports she has been taking it with her other medications, including her PPI.    I reviewed patient's thyroid tests:  Lab Results  Component Value Date   TSH 65.60 (A) 11/22/2020   TSH 2.78 09/07/2020     Pt describes: - fatigue - depression - dry skin - hair loss  Pt denies feeling nodules in neck,  she does have some mild hoarseness-attributes this to allergies, has no dysphagia/odynophagia, SOB with lying down.  she denies family history of thyroid disorders that she is aware of.  No family history of thyroid cancer.  No history of radiation therapy to head or neck.  No recent use of iodine supplements.  Denies use of Biotin containing supplements.  I reviewed her chart and she also has a history of GERD.   ROS:  Constitutional: + weight loss- intentional-seeing nutritionist, + fatigue, no subjective hyperthermia, no subjective hypothermia, reports brain fog Eyes: no blurry vision, no xerophthalmia ENT: no sore throat, no nodules palpated in throat, no dysphagia/odynophagia, + mild hoarseness Cardiovascular: no chest pain, no SOB, no palpitations, no leg swelling Respiratory: no cough, no SOB Gastrointestinal: no nausea/vomiting/diarrhea Musculoskeletal: no muscle/joint aches Skin: no rashes, + hair loss Neurological: no tremors, no numbness, no tingling, no dizziness Psychiatric: no depression, no anxiety   Objective:   Objective     BP (!) 168/96   Pulse 69   Ht 5\' 4"  (1.626 m)   Wt 259 lb (117.5 kg)   BMI 44.46 kg/m  Wt Readings from Last 3 Encounters:  12/22/20 259 lb (117.5 kg)  10/19/20 264 lb 5.3 oz (119.9 kg)  06/29/20 264 lb 6.4 oz (119.9 kg)    BP Readings  from Last 3 Encounters:  12/22/20 (!) 168/96  11/08/20 (!) 151/81  10/25/20 (!) 142/93     Constitutional:  Body mass index is 44.46 kg/m., not in acute distress, normal state of mind Eyes: PERRLA, EOMI, no exophthalmos ENT: moist mucous membranes, no thyromegaly, no cervical lymphadenopathy Cardiovascular: normal precordial activity, RRR, no murmur/rubs/gallops Respiratory:  adequate breathing efforts, no gross chest deformity, Clear to auscultation bilaterally  Gastrointestinal: abdomen soft, non-tender, no distension, bowel sounds present Musculoskeletal: no gross deformities, strength intact in all four extremities Skin: moist, warm, no rashes Neurological: no tremor with outstretched hands, deep tendon reflexes normal in BLE.   CMP ( most recent) CMP  No results found for: NA, K, CL, CO2, GLUCOSE, BUN, CREATININE, CALCIUM, PROT, ALBUMIN, AST, ALT, ALKPHOS, BILITOT, GFRNONAA, GFRAA   Diabetic Labs (most recent): No results found for: HGBA1C   Lipid Panel ( most recent) Lipid Panel  No results found for: CHOL, TRIG, HDL, CHOLHDL, VLDL, LDLCALC, LDLDIRECT, LABVLDL     Lab Results  Component Value Date   TSH 65.60 (A) 11/22/2020   TSH 2.78 09/07/2020      Assessment & Plan:   ASSESSMENT / PLAN:  1. Hypothyroidism   Patient with long-standing hypothyroidism, on levothyroxine therapy. On physical exam patient does not have gross goiter, thyroid nodules, or neck compression symptoms.  - We discussed about correct intake of levothyroxine, at fasting, with water, separated by at least 30 minutes from breakfast, and separated by more than 4 hours from calcium, iron, multivitamins, acid reflux medications (PPIs). -Patient is made aware of the fact that thyroid hormone replacement is needed for life, dose to be adjusted by periodic monitoring of thyroid function tests. -She is advised to continue her current dose of Levothyroxine at 137 mcg po daily before breakfast but to  take it properly as discussed today.  - Will check thyroid tests before next visit: TSH, free T4.  Will also order baseline thyroid ultrasound to assess anatomy since she has never had any imaging previously.     - Time spent with the patient: 45 minutes, of which >50% was spent in obtaining information about her symptoms, reviewing her previous labs, evaluations, and treatments, counseling her about her  hypothyroidism , and developing a plan to confirm the diagnosis and long term treatment as necessary. Please refer to "Patient Self Inventory" in the Media tab for reviewed elements of pertinent patient history.  Siobahn Worsley Remillard participated in the discussions, expressed understanding, and voiced agreement with the above plans.  All questions were answered to her satisfaction. she is encouraged to contact clinic should she have any questions or concerns prior to her return visit.   FOLLOW UP PLAN:  Return in about 7 weeks (around 02/09/2021) for Thyroid follow up, Previsit labs, thyroid ultrasound.  Rayetta Pigg, [facility] [address] [place], [place] [address] Phone: [phone] Fax: [phone]  12/22/2020, 9:30 AM

## 2021-01-12 DIAGNOSIS — S233XXA Sprain of ligaments of thoracic spine, initial encounter: Secondary | ICD-10-CM | POA: Diagnosis not present

## 2021-01-12 DIAGNOSIS — S338XXA Sprain of other parts of lumbar spine and pelvis, initial encounter: Secondary | ICD-10-CM | POA: Diagnosis not present

## 2021-01-12 DIAGNOSIS — S134XXA Sprain of ligaments of cervical spine, initial encounter: Secondary | ICD-10-CM | POA: Diagnosis not present

## 2021-01-12 DIAGNOSIS — M9903 Segmental and somatic dysfunction of lumbar region: Secondary | ICD-10-CM | POA: Diagnosis not present

## 2021-01-12 DIAGNOSIS — M9901 Segmental and somatic dysfunction of cervical region: Secondary | ICD-10-CM | POA: Diagnosis not present

## 2021-01-12 DIAGNOSIS — M9902 Segmental and somatic dysfunction of thoracic region: Secondary | ICD-10-CM | POA: Diagnosis not present

## 2021-01-21 DIAGNOSIS — E039 Hypothyroidism, unspecified: Secondary | ICD-10-CM | POA: Diagnosis not present

## 2021-01-22 LAB — TSH: TSH: 9.22 u[IU]/mL — ABNORMAL HIGH (ref 0.450–4.500)

## 2021-01-22 LAB — T4, FREE: Free T4: 0.95 ng/dL (ref 0.82–1.77)

## 2021-01-25 NOTE — Progress Notes (Deleted)
Referring Provider: Rosalee Kaufman, * Primary Care Physician:  Rosalee Kaufman, PA-C Primary GI Physician: Dr. Gala Romney  No chief complaint on file.   HPI:   Whitney Reed is a 70 y.o. female with a history of GERD, gastroparesis with abnormal gastric emptying study in 2001, diverticulitis March 2021. Colonoscopy up-to-date in June 2021 with pancolonic diverticulosis, nonbleeding internal hemorrhoids.  No recommendation to repeat due to age.  She is presenting today for follow-up ***  Last seen in our office 06/29/2020.  She reported several weeks of intermittent gnawing epigastric pain not necessarily affected by meals or bowel movements.  Typical reflux well controlled on Protonix 40 mg twice daily.  Denies dysphagia, melena, BRBPR.  Admitted to taking 2 g of ibuprofen daily, up to 3 g in a single day as well as 2-3 Goody powders daily for various aches and pains including headache. 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 Recommended discontinuation of NSAIDs, continuing Protonix BID, and H. Pylori stool test. EGD if pain doesn't resolve. Start benefiber for mild constipation.   H pylori stool test was negative.   Today:   Past Medical History:  Diagnosis Date   Anxiety    Arthritis    osteo   Chronic back pain    Constipation    GERD (gastroesophageal reflux disease)    Hypothyroidism    PONV (postoperative nausea and vomiting)     Past Surgical History:  Procedure Laterality Date   ABDOMINAL HYSTERECTOMY     CATARACT EXTRACTION W/PHACO Right 10/25/2020   Procedure: CATARACT EXTRACTION PHACO AND INTRAOCULAR LENS PLACEMENT RIGHT EYE;  Surgeon: Baruch Goldmann, MD;  Location: [facility];  Service: Ophthalmology;  Laterality: Right;  right CDE=4.59   CATARACT EXTRACTION W/PHACO Left 11/08/2020   Procedure: CATARACT EXTRACTION PHACO AND INTRAOCULAR LENS PLACEMENT LEFT EYE;  Surgeon: Baruch Goldmann, MD;  Location: [facility];  Service: Ophthalmology;   Laterality: Left;  left CDE=5.37   CHOLECYSTECTOMY     COLONOSCOPY     COLONOSCOPY WITH PROPOFOL N/A 12/02/2019   Procedure: COLONOSCOPY WITH PROPOFOL;  Surgeon: Daneil Dolin, MD;  Location: [facility];  Service: Endoscopy;  Laterality: N/A;  10:15am   ESOPHAGOGASTRODUODENOSCOPY     PILONIDAL CYST EXCISION     TONSILLECTOMY      Current Outpatient Medications  Medication Sig Dispense Refill   acetaminophen (TYLENOL) 500 MG tablet Take 1,000 mg by mouth 3 (three) times daily as needed for moderate pain.     albuterol (VENTOLIN HFA) 108 (90 Base) MCG/ACT inhaler Inhale 1-2 puffs into the lungs every 6 (six) hours as needed for wheezing or shortness of breath.     ALPRAZolam (XANAX) 1 MG tablet Take 1 mg by mouth 3 (three) times daily as needed for anxiety.      DULoxetine (CYMBALTA) 30 MG capsule Take 30 mg by mouth 3 (three) times daily.     levothyroxine (SYNTHROID) 137 MCG tablet Take 137 mcg by mouth daily before breakfast.     pantoprazole (PROTONIX) 40 MG tablet Take 40 mg by mouth 2 (two) times daily.     Polyethyl Glycol-Propyl Glycol (LUBRICANT EYE DROPS) 0.4-0.3 % SOLN Place 1 drop into both eyes 3 (three) times daily as needed (dry/irritated eyes.).     No current facility-administered medications for this visit.    Allergies as of 01/26/2021 - Review Complete 12/22/2020  Allergen Reaction Noted   Erythromycin Rash     Family History  Problem Relation Age of  Onset   Colon cancer Neg Hx     Social History   Socioeconomic History   Marital status: Married    Spouse name: Not on file   Number of children: Not on file   Years of education: Not on file   Highest education level: Not on file  Occupational History   Not on file  Tobacco Use   Smoking status: Never   Smokeless tobacco: Never  Vaping Use   Vaping Use: Never used  Substance and Sexual Activity   Alcohol use: Never   Drug use: Never   Sexual activity: Yes  Other Topics Concern   Not on file   Social History Narrative   Not on file   Social Determinants of Health   Financial Resource Strain: Not on file  Food Insecurity: Not on file  Transportation Needs: Not on file  Physical Activity: Not on file  Stress: Not on file  Social Connections: Not on file    Review of Systems: Gen: Denies fever, chills, anorexia. Denies fatigue, weakness, weight loss.  CV: Denies chest pain, palpitations, syncope, peripheral edema, and claudication. Resp: Denies dyspnea at rest, cough, wheezing, coughing up blood, and pleurisy. GI: Denies vomiting blood, jaundice, and fecal incontinence.   Denies dysphagia or odynophagia. Derm: Denies rash, itching, dry skin Psych: Denies depression, anxiety, memory loss, confusion. No homicidal or suicidal ideation.  Heme: Denies bruising, bleeding, and enlarged lymph nodes.  Physical Exam: There were no vitals taken for this visit. General:   Alert and oriented. No distress noted. Pleasant and cooperative.  Head:  Normocephalic and atraumatic. Eyes:  Conjuctiva clear without scleral icterus. Mouth:  Oral mucosa pink and moist. Good dentition. No lesions. Heart:  S1, S2 present without murmurs appreciated. Lungs:  Clear to auscultation bilaterally. No wheezes, rales, or rhonchi. No distress.  Abdomen:  +BS, soft, non-tender and non-distended. No rebound or guarding. No HSM or masses noted. Msk:  Symmetrical without gross deformities. Normal posture. Extremities:  Without edema. Neurologic:  Alert and  oriented x4 Psych:  Alert and cooperative. Normal mood and affect.

## 2021-01-26 ENCOUNTER — Ambulatory Visit: Payer: Medicare Other | Admitting: Gastroenterology

## 2021-01-26 ENCOUNTER — Encounter: Payer: Self-pay | Admitting: Internal Medicine

## 2021-01-28 ENCOUNTER — Ambulatory Visit (HOSPITAL_COMMUNITY)
Admission: RE | Admit: 2021-01-28 | Discharge: 2021-01-28 | Disposition: A | Discharge status: Home / Self Care | Payer: Medicare Other | Source: Ambulatory Visit | Attending: Nurse Practitioner | Admitting: Nurse Practitioner

## 2021-01-28 ENCOUNTER — Other Ambulatory Visit: Payer: Self-pay

## 2021-01-28 DIAGNOSIS — E063 Autoimmune thyroiditis: Secondary | ICD-10-CM | POA: Diagnosis not present

## 2021-01-28 DIAGNOSIS — E038 Other specified hypothyroidism: Secondary | ICD-10-CM | POA: Diagnosis not present

## 2021-02-02 DIAGNOSIS — J019 Acute sinusitis, unspecified: Secondary | ICD-10-CM | POA: Diagnosis not present

## 2021-02-02 DIAGNOSIS — J45901 Unspecified asthma with (acute) exacerbation: Secondary | ICD-10-CM | POA: Diagnosis not present

## 2021-02-09 ENCOUNTER — Ambulatory Visit: Payer: Medicare Other | Admitting: Nurse Practitioner

## 2021-02-15 DIAGNOSIS — H1031 Unspecified acute conjunctivitis, right eye: Secondary | ICD-10-CM | POA: Diagnosis not present

## 2021-02-16 ENCOUNTER — Encounter: Payer: Self-pay | Admitting: Nurse Practitioner

## 2021-02-16 ENCOUNTER — Ambulatory Visit (INDEPENDENT_AMBULATORY_CARE_PROVIDER_SITE_OTHER): Payer: Medicare Other | Admitting: Nurse Practitioner

## 2021-02-16 ENCOUNTER — Other Ambulatory Visit: Payer: Self-pay

## 2021-02-16 VITALS — BP 145/86 | HR 90 | Ht 64.0 in | Wt 261.0 lb

## 2021-02-16 DIAGNOSIS — E038 Other specified hypothyroidism: Secondary | ICD-10-CM | POA: Diagnosis not present

## 2021-02-16 DIAGNOSIS — E063 Autoimmune thyroiditis: Secondary | ICD-10-CM | POA: Diagnosis not present

## 2021-02-16 MED ORDER — LEVOTHYROXINE SODIUM 137 MCG PO TABS: 137.0000 ug | ORAL_TABLET | Freq: Every day | ORAL | 1 refills | Status: DC

## 2021-02-16 NOTE — Patient Instructions (Signed)

## 2021-02-16 NOTE — Progress Notes (Signed)
Endocrinology Follow Up Note                                         02/16/2021, 3:06 PM  Subjective:   Subjective    Whitney Reed is a 70 y.o.-year-old female patient being seen in follow up after being seen in consultation for hypothyroidism referred by Rosalee Kaufman, PA-C.   Past Medical History:  Diagnosis Date   Anxiety    Arthritis    osteo   Chronic back pain    Constipation    GERD (gastroesophageal reflux disease)    Hypothyroidism    PONV (postoperative nausea and vomiting)     Past Surgical History:  Procedure Laterality Date   ABDOMINAL HYSTERECTOMY     CATARACT EXTRACTION W/PHACO Right 10/25/2020   Procedure: CATARACT EXTRACTION PHACO AND INTRAOCULAR LENS PLACEMENT RIGHT EYE;  Surgeon: Baruch Goldmann, MD;  Location: [facility];  Service: Ophthalmology;  Laterality: Right;  right CDE=4.59   CATARACT EXTRACTION W/PHACO Left 11/08/2020   Procedure: CATARACT EXTRACTION PHACO AND INTRAOCULAR LENS PLACEMENT LEFT EYE;  Surgeon: Baruch Goldmann, MD;  Location: [facility];  Service: Ophthalmology;  Laterality: Left;  left CDE=5.37   CHOLECYSTECTOMY     COLONOSCOPY     COLONOSCOPY WITH PROPOFOL N/A 12/02/2019   Procedure: COLONOSCOPY WITH PROPOFOL;  Surgeon: Daneil Dolin, MD;  [facility]: [facility];  Service: Endoscopy;  Laterality: N/A;  10:15am   ESOPHAGOGASTRODUODENOSCOPY     PILONIDAL CYST EXCISION     TONSILLECTOMY      Social History   Socioeconomic History   Marital status: Married    Spouse name: Not on file   Number of children: Not on file   Years of education: Not on file   Highest education level: Not on file  Occupational History   Not on file  Tobacco Use   Smoking status: Never   Smokeless tobacco: Never  Vaping Use   Vaping Use: Never used  Substance and Sexual Activity   Alcohol use: Never   Drug use: Never   Sexual activity: Yes  Other Topics Concern   Not on file   Social History Narrative   Not on file   Social Determinants of Health   Financial Resource Strain: Not on file  Food Insecurity: Not on file  Transportation Needs: Not on file  Physical Activity: Not on file  Stress: Not on file  Social Connections: Not on file    Family History  Problem Relation Age of Onset   Colon cancer Neg Hx     Outpatient Encounter Medications as of 02/16/2021  Medication Sig   acetaminophen (TYLENOL) 500 MG tablet Take 1,000 mg by mouth 3 (three) times daily as needed for moderate pain.   albuterol (VENTOLIN HFA) 108 (90 Base) MCG/ACT inhaler Inhale 1-2 puffs into the lungs every 6 (six) hours as needed for wheezing or shortness of breath.   ALPRAZolam (XANAX) 1 MG tablet Take 1 mg by mouth 3 (three) times daily as  needed for anxiety.    DULoxetine (CYMBALTA) 30 MG capsule Take 30 mg by mouth 3 (three) times daily.   levothyroxine (SYNTHROID) 137 MCG tablet Take 1 tablet (137 mcg total) by mouth daily before breakfast.   pantoprazole (PROTONIX) 40 MG tablet Take 40 mg by mouth 2 (two) times daily.   Polyethyl Glycol-Propyl Glycol (LUBRICANT EYE DROPS) 0.4-0.3 % SOLN Place 1 drop into both eyes 3 (three) times daily as needed (dry/irritated eyes.).   [DISCONTINUED] levothyroxine (SYNTHROID) 137 MCG tablet Take 137 mcg by mouth daily before breakfast.   No facility-administered encounter medications on file as of 02/16/2021.    ALLERGIES: Allergies  Allergen Reactions   Erythromycin Rash   VACCINATION STATUS:  There is no immunization history on file for this patient.   HPI  Thyroid Problem Presents for follow-up visit. Patient reports no fatigue, hair loss, hoarse voice, palpitations, tremors or weight loss. (Complains of recent feelings of jitteriness and dizziness.) The symptoms have been improving. Past treatments include levothyroxine. The treatment provided mild relief. Her past medical history is significant for obesity. There are no known  risk factors.   Whitney Reed  is a patient with the above medical history. she was diagnosed with Hashimoto's hypothyroidism at approximate age of 77 years, which required subsequent initiation of thyroid hormone replacement. she was given various doses of Levothyroxine over the years, currently on 137 micrograms. she reports compliance to this medication.  I reviewed patient's thyroid tests:  Lab Results  Component Value Date   TSH 9.220 (H) 01/21/2021   TSH 65.60 (A) 11/22/2020   TSH 2.78 09/07/2020   FREET4 0.95 01/21/2021     Pt denies feeling nodules in neck,  she does have some mild hoarseness-attributes this to allergies, has no dysphagia/odynophagia, SOB with lying down.  she denies family history of thyroid disorders that she is aware of.  No family history of thyroid cancer.  No history of radiation therapy to head or neck.  No recent use of iodine supplements.  Denies use of Biotin containing supplements.  I reviewed her chart and she also has a history of GERD.   ROS:  Constitutional: + steady weight, + fatigue-improved , no subjective hyperthermia, no subjective hypothermia,  Eyes: no blurry vision, no xerophthalmia ENT: no sore throat, no nodules palpated in throat, no dysphagia/odynophagia, + mild hoarseness Cardiovascular: no chest pain, no SOB, no palpitations, no leg swelling Respiratory: no cough, no SOB Gastrointestinal: no nausea/vomiting/diarrhea Musculoskeletal: no muscle/joint aches Skin: no rashes, + hair loss-improved Neurological: no tremors, no numbness, no tingling, + recent dizziness, + jitteriness Psychiatric: no depression, no anxiety   Objective:   Objective     BP (!) 145/86   Pulse 90   Ht '5\' 4"'$  (1.626 m)   Wt 261 lb (118.4 kg)   BMI 44.80 kg/m  Wt Readings from Last 3 Encounters:  02/16/21 261 lb (118.4 kg)  12/22/20 259 lb (117.5 kg)  10/19/20 264 lb 5.3 oz (119.9 kg)    BP Readings from Last 3 Encounters:  02/16/21 (!)  145/86  12/22/20 (!) 168/96  11/08/20 (!) 151/81     Physical Exam- Limited  Constitutional:  Body mass index is 44.8 kg/m. , not in acute distress, normal state of mind Eyes:  EOMI, no exophthalmos Neck: Supple Cardiovascular: RRR, no murmurs, rubs, or gallops, no edema Respiratory: Adequate breathing efforts, no crackles, rales, rhonchi, or wheezing Musculoskeletal: no gross deformities, strength intact in all four extremities, no gross restriction of  joint movements Skin:  no rashes, no hyperemia Neurological: no tremor with outstretched hands   CMP ( most recent) CMP  No results found for: NA, K, CL, CO2, GLUCOSE, BUN, CREATININE, CALCIUM, PROT, ALBUMIN, AST, ALT, ALKPHOS, BILITOT, GFRNONAA, GFRAA   Diabetic Labs (most recent): No results found for: HGBA1C   Lipid Panel ( most recent) Lipid Panel  No results found for: CHOL, TRIG, HDL, CHOLHDL, VLDL, LDLCALC, LDLDIRECT, LABVLDL     Lab Results  Component Value Date   TSH 9.220 (H) 01/21/2021   TSH 65.60 (A) 11/22/2020   TSH 2.78 09/07/2020   FREET4 0.95 01/21/2021      Assessment & Plan:   ASSESSMENT / PLAN:  1. Hypothyroidism- r/t Hashimoto's thyroiditis -Her previsit thyroid function tests are consistent with under-replacement but given her reports of dizziness and jitteriness which recently started, will hold of on adjusting her medication at this time.  She is advised to continue Levothyroxine 137 mcg po daily before breakfast.  Will recheck TFTs in 4 weeks.   Patient with long-standing hypothyroidism, on Levothyroxine therapy. On physical exam patient does not have gross goiter, thyroid nodules, or neck compression symptoms.  - We discussed about correct intake of levothyroxine, at fasting, with water, separated by at least 30 minutes from breakfast, and separated by more than 4 hours from calcium, iron, multivitamins, acid reflux medications (PPIs). -Patient is made aware of the fact that thyroid hormone  replacement is needed for life, dose to be adjusted by periodic monitoring of thyroid function tests.        I spent 20 minutes in the care of the patient today including review of labs from Thyroid Function, CMP, and other relevant labs ; imaging/biopsy records (current and previous including abstractions from other facilities); face-to-face time discussing  her lab results and symptoms, medications doses, her options of short and long term treatment based on the latest standards of care / guidelines;   and documenting the encounter.  Yolando Mcginn Guadron  participated in the discussions, expressed understanding, and voiced agreement with the above plans.  All questions were answered to her satisfaction. she is encouraged to contact clinic should she have any questions or concerns prior to her return visit.   FOLLOW UP PLAN:  Return in about 5 weeks (around 03/23/2021) for Thyroid follow up, Previsit labs.    Rayetta Pigg, [facility] [address] [place], [place] [address] Phone: [phone] Fax: [phone]  02/16/2021, 3:06 PM

## 2021-03-04 DIAGNOSIS — M545 Low back pain, unspecified: Secondary | ICD-10-CM | POA: Diagnosis not present

## 2021-03-04 DIAGNOSIS — R300 Dysuria: Secondary | ICD-10-CM | POA: Diagnosis not present

## 2021-03-16 DIAGNOSIS — M9903 Segmental and somatic dysfunction of lumbar region: Secondary | ICD-10-CM | POA: Diagnosis not present

## 2021-03-16 DIAGNOSIS — S233XXA Sprain of ligaments of thoracic spine, initial encounter: Secondary | ICD-10-CM | POA: Diagnosis not present

## 2021-03-16 DIAGNOSIS — M9902 Segmental and somatic dysfunction of thoracic region: Secondary | ICD-10-CM | POA: Diagnosis not present

## 2021-03-16 DIAGNOSIS — E063 Autoimmune thyroiditis: Secondary | ICD-10-CM | POA: Diagnosis not present

## 2021-03-16 DIAGNOSIS — S338XXA Sprain of other parts of lumbar spine and pelvis, initial encounter: Secondary | ICD-10-CM | POA: Diagnosis not present

## 2021-03-16 DIAGNOSIS — E038 Other specified hypothyroidism: Secondary | ICD-10-CM | POA: Diagnosis not present

## 2021-03-16 DIAGNOSIS — S134XXA Sprain of ligaments of cervical spine, initial encounter: Secondary | ICD-10-CM | POA: Diagnosis not present

## 2021-03-16 DIAGNOSIS — M9901 Segmental and somatic dysfunction of cervical region: Secondary | ICD-10-CM | POA: Diagnosis not present

## 2021-03-17 LAB — T4, FREE: Free T4: 1.37 ng/dL (ref 0.82–1.77)

## 2021-03-17 LAB — TSH: TSH: 7.75 u[IU]/mL — ABNORMAL HIGH (ref 0.450–4.500)

## 2021-03-21 DIAGNOSIS — S233XXA Sprain of ligaments of thoracic spine, initial encounter: Secondary | ICD-10-CM | POA: Diagnosis not present

## 2021-03-21 DIAGNOSIS — M9903 Segmental and somatic dysfunction of lumbar region: Secondary | ICD-10-CM | POA: Diagnosis not present

## 2021-03-21 DIAGNOSIS — M9902 Segmental and somatic dysfunction of thoracic region: Secondary | ICD-10-CM | POA: Diagnosis not present

## 2021-03-21 DIAGNOSIS — S338XXA Sprain of other parts of lumbar spine and pelvis, initial encounter: Secondary | ICD-10-CM | POA: Diagnosis not present

## 2021-03-21 DIAGNOSIS — M9901 Segmental and somatic dysfunction of cervical region: Secondary | ICD-10-CM | POA: Diagnosis not present

## 2021-03-21 DIAGNOSIS — S134XXA Sprain of ligaments of cervical spine, initial encounter: Secondary | ICD-10-CM | POA: Diagnosis not present

## 2021-03-22 NOTE — Patient Instructions (Signed)

## 2021-03-23 ENCOUNTER — Other Ambulatory Visit: Payer: Self-pay

## 2021-03-23 ENCOUNTER — Encounter: Payer: Self-pay | Admitting: Nurse Practitioner

## 2021-03-23 ENCOUNTER — Ambulatory Visit: Payer: Medicare Other | Admitting: Nurse Practitioner

## 2021-03-23 VITALS — BP 142/77 | HR 87 | Ht 64.0 in | Wt 264.6 lb

## 2021-03-23 DIAGNOSIS — E063 Autoimmune thyroiditis: Secondary | ICD-10-CM | POA: Diagnosis not present

## 2021-03-23 DIAGNOSIS — E038 Other specified hypothyroidism: Secondary | ICD-10-CM | POA: Diagnosis not present

## 2021-03-23 MED ORDER — LEVOTHYROXINE SODIUM 137 MCG PO TABS: 137.0000 ug | ORAL_TABLET | Freq: Every day | ORAL | 1 refills | Status: DC

## 2021-03-23 NOTE — Progress Notes (Signed)
Endocrinology Follow Up Note                                         03/23/2021, 3:51 PM  Subjective:   Subjective    Whitney Reed is a 70 y.o.-year-old female patient being seen in follow up after being seen in consultation for hypothyroidism referred by Rosalee Kaufman, PA-C.   Past Medical History:  Diagnosis Date   Anxiety    Arthritis    osteo   Chronic back pain    Constipation    GERD (gastroesophageal reflux disease)    Hypothyroidism    PONV (postoperative nausea and vomiting)     Past Surgical History:  Procedure Laterality Date   ABDOMINAL HYSTERECTOMY     CATARACT EXTRACTION W/PHACO Right 10/25/2020   Procedure: CATARACT EXTRACTION PHACO AND INTRAOCULAR LENS PLACEMENT RIGHT EYE;  Surgeon: Baruch Goldmann, MD;  Location: [facility];  Service: Ophthalmology;  Laterality: Right;  right CDE=4.59   CATARACT EXTRACTION W/PHACO Left 11/08/2020   Procedure: CATARACT EXTRACTION PHACO AND INTRAOCULAR LENS PLACEMENT LEFT EYE;  Surgeon: Baruch Goldmann, MD;  Location: [facility];  Service: Ophthalmology;  Laterality: Left;  left CDE=5.37   CHOLECYSTECTOMY     COLONOSCOPY     COLONOSCOPY WITH PROPOFOL N/A 12/02/2019   Procedure: COLONOSCOPY WITH PROPOFOL;  Surgeon: Daneil Dolin, MD;  [facility]: [facility];  Service: Endoscopy;  Laterality: N/A;  10:15am   ESOPHAGOGASTRODUODENOSCOPY     PILONIDAL CYST EXCISION     TONSILLECTOMY      Social History   Socioeconomic History   Marital status: Married    Spouse name: Not on file   Number of children: Not on file   Years of education: Not on file   Highest education level: Not on file  Occupational History   Not on file  Tobacco Use   Smoking status: Never   Smokeless tobacco: Never  Vaping Use   Vaping Use: Never used  Substance and Sexual Activity   Alcohol use: Never   Drug use: Never   Sexual activity: Yes  Other Topics Concern   Not on file   Social History Narrative   Not on file   Social Determinants of Health   Financial Resource Strain: Not on file  Food Insecurity: Not on file  Transportation Needs: Not on file  Physical Activity: Not on file  Stress: Not on file  Social Connections: Not on file    Family History  Problem Relation Age of Onset   Colon cancer Neg Hx     Outpatient Encounter Medications as of 03/23/2021  Medication Sig   acetaminophen (TYLENOL) 500 MG tablet Take 1,000 mg by mouth 3 (three) times daily as needed for moderate pain.   albuterol (VENTOLIN HFA) 108 (90 Base) MCG/ACT inhaler Inhale 1-2 puffs into the lungs every 6 (six) hours as needed for wheezing or shortness of breath.   ALPRAZolam (XANAX) 1 MG tablet Take 1 mg by mouth 3 (three) times daily as  needed for anxiety.    DULoxetine (CYMBALTA) 30 MG capsule Take 30 mg by mouth 3 (three) times daily.   pantoprazole (PROTONIX) 40 MG tablet Take 40 mg by mouth 2 (two) times daily.   Polyethyl Glycol-Propyl Glycol (LUBRICANT EYE DROPS) 0.4-0.3 % SOLN Place 1 drop into both eyes 3 (three) times daily as needed (dry/irritated eyes.).   [DISCONTINUED] levothyroxine (SYNTHROID) 137 MCG tablet Take 1 tablet (137 mcg total) by mouth daily before breakfast.   levothyroxine (SYNTHROID) 137 MCG tablet Take 1 tablet (137 mcg total) by mouth daily before breakfast.   No facility-administered encounter medications on file as of 03/23/2021.    ALLERGIES: Allergies  Allergen Reactions   Erythromycin Rash   VACCINATION STATUS:  There is no immunization history on file for this patient.   HPI  Thyroid Problem Presents for follow-up visit. Patient reports no fatigue, hair loss, hoarse voice, palpitations, tremors or weight loss. (Complains of recent feelings of jitteriness and dizziness which has improved but are still present) The symptoms have been improving. Past treatments include levothyroxine. The treatment provided mild relief. Her past medical  history is significant for obesity. There are no known risk factors.   Whitney Reed  is a patient with the above medical history. she was diagnosed with Hashimoto's hypothyroidism at approximate age of 54 years, which required subsequent initiation of thyroid hormone replacement. she was given various doses of Levothyroxine over the years, currently on 137 micrograms. she reports compliance to this medication.  I reviewed patient's thyroid tests:  Lab Results  Component Value Date   TSH 7.750 (H) 03/16/2021   TSH 9.220 (H) 01/21/2021   TSH 65.60 (A) 11/22/2020   TSH 2.78 09/07/2020   FREET4 1.37 03/16/2021   FREET4 0.95 01/21/2021     Pt denies feeling nodules in neck,  she does have some mild hoarseness-attributes this to allergies, has no dysphagia/odynophagia, SOB with lying down.  she denies family history of thyroid disorders that she is aware of.  No family history of thyroid cancer.  No history of radiation therapy to head or neck.  No recent use of iodine supplements.  Denies use of Biotin containing supplements.  I reviewed her chart and she also has a history of GERD.   ROS:  Constitutional: + steady weight, + fatigue-improved , no subjective hyperthermia, no subjective hypothermia,  Eyes: no blurry vision, no xerophthalmia ENT: no sore throat, no nodules palpated in throat, no dysphagia/odynophagia, + mild hoarseness Cardiovascular: no chest pain, no SOB, no palpitations, no leg swelling Respiratory: no cough, no SOB Gastrointestinal: alternating constipation and diarrhea- has F/u with GI specialist soon Musculoskeletal: no muscle/joint aches Skin: no rashes, + hair loss-improved Neurological: no tremors, no numbness, no tingling, + dizziness, + jitteriness (improved but still present) Psychiatric: no depression, no anxiety   Objective:   Objective     BP (!) 142/77   Pulse 87   Ht 5\' 4"  (1.626 m)   Wt 264 lb 9.6 oz (120 kg)   BMI 45.42 kg/m  Wt  Readings from Last 3 Encounters:  03/23/21 264 lb 9.6 oz (120 kg)  02/16/21 261 lb (118.4 kg)  12/22/20 259 lb (117.5 kg)    BP Readings from Last 3 Encounters:  03/23/21 (!) 142/77  02/16/21 (!) 145/86  12/22/20 (!) 168/96     Physical Exam- Limited  Constitutional:  Body mass index is 45.42 kg/m. , not in acute distress, normal state of mind Eyes:  EOMI, no exophthalmos Neck:  Supple Cardiovascular: RRR, no murmurs, rubs, or gallops, no edema Respiratory: Adequate breathing efforts, no crackles, rales, rhonchi, or wheezing Musculoskeletal: no gross deformities, strength intact in all four extremities, no gross restriction of joint movements Skin:  no rashes, no hyperemia Neurological: no tremor with outstretched hands   CMP ( most recent) CMP  No results found for: NA, K, CL, CO2, GLUCOSE, BUN, CREATININE, CALCIUM, PROT, ALBUMIN, AST, ALT, ALKPHOS, BILITOT, GFRNONAA, GFRAA   Diabetic Labs (most recent): No results found for: HGBA1C   Lipid Panel ( most recent) Lipid Panel  No results found for: CHOL, TRIG, HDL, CHOLHDL, VLDL, LDLCALC, LDLDIRECT, LABVLDL     Lab Results  Component Value Date   TSH 7.750 (H) 03/16/2021   TSH 9.220 (H) 01/21/2021   TSH 65.60 (A) 11/22/2020   TSH 2.78 09/07/2020   FREET4 1.37 03/16/2021   FREET4 0.95 01/21/2021      Assessment & Plan:   ASSESSMENT / PLAN:  1. Hypothyroidism- r/t Hashimoto's thyroiditis -Her previsit thyroid function tests show great improvement in Free T4 level and some improvement in TSH.  Given her continued jitteriness and dizziness, I recommend staying at same dose of Levothyroxine 137 mcg po daily before breakfast for now and recheck TFTs again in another 2 months to see where her levels are.   Patient with long-standing hypothyroidism, on Levothyroxine therapy. On physical exam patient does not have gross goiter, thyroid nodules, or neck compression symptoms.  - We discussed about correct intake of  levothyroxine, at fasting, with water, separated by at least 30 minutes from breakfast, and separated by more than 4 hours from calcium, iron, multivitamins, acid reflux medications (PPIs). -Patient is made aware of the fact that thyroid hormone replacement is needed for life, dose to be adjusted by periodic monitoring of thyroid function tests.        I spent 20 minutes in the care of the patient today including review of labs from Thyroid Function, CMP, and other relevant labs ; imaging/biopsy records (current and previous including abstractions from other facilities); face-to-face time discussing  her lab results and symptoms, medications doses, her options of short and long term treatment based on the latest standards of care / guidelines;   and documenting the encounter.  Whitney Reed  participated in the discussions, expressed understanding, and voiced agreement with the above plans.  All questions were answered to her satisfaction. she is encouraged to contact clinic should she have any questions or concerns prior to her return visit.   FOLLOW UP PLAN:  Return in about 2 months (around 05/23/2021) for Thyroid follow up, Previsit labs.    Rayetta Pigg, [facility] [address] [place], [place] [address] Phone: [phone] Fax: [phone]  03/23/2021, 3:51 PM

## 2021-03-28 DIAGNOSIS — S338XXA Sprain of other parts of lumbar spine and pelvis, initial encounter: Secondary | ICD-10-CM | POA: Diagnosis not present

## 2021-03-28 DIAGNOSIS — M9903 Segmental and somatic dysfunction of lumbar region: Secondary | ICD-10-CM | POA: Diagnosis not present

## 2021-03-28 DIAGNOSIS — M9901 Segmental and somatic dysfunction of cervical region: Secondary | ICD-10-CM | POA: Diagnosis not present

## 2021-03-28 DIAGNOSIS — S134XXA Sprain of ligaments of cervical spine, initial encounter: Secondary | ICD-10-CM | POA: Diagnosis not present

## 2021-03-28 DIAGNOSIS — M9902 Segmental and somatic dysfunction of thoracic region: Secondary | ICD-10-CM | POA: Diagnosis not present

## 2021-03-28 DIAGNOSIS — S233XXA Sprain of ligaments of thoracic spine, initial encounter: Secondary | ICD-10-CM | POA: Diagnosis not present

## 2021-04-04 DIAGNOSIS — R1084 Generalized abdominal pain: Secondary | ICD-10-CM | POA: Diagnosis not present

## 2021-04-04 DIAGNOSIS — R1013 Epigastric pain: Secondary | ICD-10-CM | POA: Diagnosis not present

## 2021-04-04 DIAGNOSIS — K219 Gastro-esophageal reflux disease without esophagitis: Secondary | ICD-10-CM | POA: Diagnosis not present

## 2021-04-08 DIAGNOSIS — R1084 Generalized abdominal pain: Secondary | ICD-10-CM | POA: Diagnosis not present

## 2021-04-08 DIAGNOSIS — R1013 Epigastric pain: Secondary | ICD-10-CM | POA: Diagnosis not present

## 2021-04-08 DIAGNOSIS — K760 Fatty (change of) liver, not elsewhere classified: Secondary | ICD-10-CM | POA: Diagnosis not present

## 2021-04-22 DIAGNOSIS — H35371 Puckering of macula, right eye: Secondary | ICD-10-CM | POA: Diagnosis not present

## 2021-04-22 DIAGNOSIS — H353132 Nonexudative age-related macular degeneration, bilateral, intermediate dry stage: Secondary | ICD-10-CM | POA: Diagnosis not present

## 2021-04-22 DIAGNOSIS — H43813 Vitreous degeneration, bilateral: Secondary | ICD-10-CM | POA: Diagnosis not present

## 2021-05-19 DIAGNOSIS — R509 Fever, unspecified: Secondary | ICD-10-CM | POA: Diagnosis not present

## 2021-05-19 DIAGNOSIS — J329 Chronic sinusitis, unspecified: Secondary | ICD-10-CM | POA: Diagnosis not present

## 2021-05-23 ENCOUNTER — Ambulatory Visit: Payer: Medicare Other | Admitting: Nurse Practitioner

## 2021-05-27 DIAGNOSIS — E038 Other specified hypothyroidism: Secondary | ICD-10-CM | POA: Diagnosis not present

## 2021-05-27 DIAGNOSIS — E063 Autoimmune thyroiditis: Secondary | ICD-10-CM | POA: Diagnosis not present

## 2021-05-28 LAB — T4, FREE: Free T4: 0.8 ng/dL — ABNORMAL LOW (ref 0.82–1.77)

## 2021-05-28 LAB — TSH: TSH: 12.2 u[IU]/mL — ABNORMAL HIGH (ref 0.450–4.500)

## 2021-06-07 ENCOUNTER — Encounter: Payer: Self-pay | Admitting: Nurse Practitioner

## 2021-06-07 ENCOUNTER — Ambulatory Visit (INDEPENDENT_AMBULATORY_CARE_PROVIDER_SITE_OTHER): Payer: Medicare Other | Admitting: Nurse Practitioner

## 2021-06-07 ENCOUNTER — Other Ambulatory Visit: Payer: Self-pay

## 2021-06-07 VITALS — BP 153/84 | HR 86 | Ht 64.0 in | Wt 264.2 lb

## 2021-06-07 DIAGNOSIS — E038 Other specified hypothyroidism: Secondary | ICD-10-CM | POA: Diagnosis not present

## 2021-06-07 DIAGNOSIS — E063 Autoimmune thyroiditis: Secondary | ICD-10-CM | POA: Diagnosis not present

## 2021-06-07 MED ORDER — LEVOTHYROXINE SODIUM 150 MCG PO TABS: 137.0000 ug | ORAL_TABLET | Freq: Every day | ORAL | 1 refills | Status: DC

## 2021-06-07 NOTE — Progress Notes (Signed)
Endocrinology Follow Up Note                                         06/07/2021, 3:49 PM  Subjective:   Subjective    Whitney Reed is a 70 y.o.-year-old female patient being seen in follow up after being seen in consultation for hypothyroidism referred by Rosalee Kaufman, PA-C.   Past Medical History:  Diagnosis Date   Anxiety    Arthritis    osteo   Chronic back pain    Constipation    GERD (gastroesophageal reflux disease)    Hypothyroidism    PONV (postoperative nausea and vomiting)     Past Surgical History:  Procedure Laterality Date   ABDOMINAL HYSTERECTOMY     CATARACT EXTRACTION W/PHACO Right 10/25/2020   Procedure: CATARACT EXTRACTION PHACO AND INTRAOCULAR LENS PLACEMENT RIGHT EYE;  Surgeon: Baruch Goldmann, MD;  Location: [facility];  Service: Ophthalmology;  Laterality: Right;  right CDE=4.59   CATARACT EXTRACTION W/PHACO Left 11/08/2020   Procedure: CATARACT EXTRACTION PHACO AND INTRAOCULAR LENS PLACEMENT LEFT EYE;  Surgeon: Baruch Goldmann, MD;  Location: [facility];  Service: Ophthalmology;  Laterality: Left;  left CDE=5.37   CHOLECYSTECTOMY     COLONOSCOPY     COLONOSCOPY WITH PROPOFOL N/A 12/02/2019   Procedure: COLONOSCOPY WITH PROPOFOL;  Surgeon: Daneil Dolin, MD;  Location: [facility];  Service: Endoscopy;  Laterality: N/A;  10:15am   ESOPHAGOGASTRODUODENOSCOPY     PILONIDAL CYST EXCISION     TONSILLECTOMY      Social History   Socioeconomic History   Marital status: Married    Spouse name: Not on file   Number of children: Not on file   Years of education: Not on file   Highest education level: Not on file  Occupational History   Not on file  Tobacco Use   Smoking status: Never   Smokeless tobacco: Never  Vaping Use   Vaping Use: Never used  Substance and Sexual Activity   Alcohol use: Never   Drug use: Never   Sexual activity: Yes  Other Topics Concern   Not on file   Social History Narrative   Not on file   Social Determinants of Health   Financial Resource Strain: Not on file  Food Insecurity: Not on file  Transportation Needs: Not on file  Physical Activity: Not on file  Stress: Not on file  Social Connections: Not on file    Family History  Problem Relation Age of Onset   Colon cancer Neg Hx     Outpatient Encounter Medications as of 06/07/2021  Medication Sig   acetaminophen (TYLENOL) 500 MG tablet Take 1,000 mg by mouth 3 (three) times daily as needed for moderate pain.   albuterol (VENTOLIN HFA) 108 (90 Base) MCG/ACT inhaler Inhale 1-2 puffs into the lungs every 6 (six) hours as needed for wheezing or shortness of breath.   ALPRAZolam (XANAX) 1 MG tablet Take 1 mg by mouth 3 (three) times daily as  needed for anxiety.    DULoxetine (CYMBALTA) 30 MG capsule Take 30 mg by mouth 3 (three) times daily.   famotidine (PEPCID) 20 MG tablet Take 20 mg by mouth 2 (two) times daily.   pantoprazole (PROTONIX) 40 MG tablet Take 40 mg by mouth 2 (two) times daily.   Polyethyl Glycol-Propyl Glycol (LUBRICANT EYE DROPS) 0.4-0.3 % SOLN Place 1 drop into both eyes 3 (three) times daily as needed (dry/irritated eyes.).   [DISCONTINUED] levothyroxine (SYNTHROID) 137 MCG tablet Take 1 tablet (137 mcg total) by mouth daily before breakfast.   levothyroxine (SYNTHROID) 150 MCG tablet Take 1 tablet (150 mcg total) by mouth daily before breakfast.   No facility-administered encounter medications on file as of 06/07/2021.    ALLERGIES: Allergies  Allergen Reactions   Erythromycin Rash   VACCINATION STATUS:  There is no immunization history on file for this patient.   HPI  Thyroid Problem Presents for follow-up visit. Patient reports no fatigue, hair loss, hoarse voice, palpitations, tremors or weight loss. (Complains of recent feelings of jitteriness and dizziness which has improved but are still present) The symptoms have been improving. Past treatments  include levothyroxine. The treatment provided mild relief. Her past medical history is significant for obesity. There are no known risk factors.   Whitney Reed  is a patient with the above medical history. she was diagnosed with Hashimoto's hypothyroidism at approximate age of 87 years, which required subsequent initiation of thyroid hormone replacement. she was given various doses of Levothyroxine over the years, currently on 137 micrograms. she reports compliance to this medication.  She did stop taking it for 2 days (about 3 weeks ago) to see if the leg cramps stopped, but they did not, so she restarted her medication.  I reviewed patient's thyroid tests:  Lab Results  Component Value Date   TSH 12.200 (H) 05/27/2021   TSH 7.750 (H) 03/16/2021   TSH 9.220 (H) 01/21/2021   TSH 65.60 (A) 11/22/2020   TSH 2.78 09/07/2020   FREET4 0.80 (L) 05/27/2021   FREET4 1.37 03/16/2021   FREET4 0.95 01/21/2021     Pt denies feeling nodules in neck,  she does have some mild hoarseness-attributes this to allergies, has no dysphagia/odynophagia, SOB with lying down.  she denies family history of thyroid disorders that she is aware of.  No family history of thyroid cancer.  No history of radiation therapy to head or neck.  No recent use of iodine supplements.  Denies use of Biotin containing supplements.  I reviewed her chart and she also has a history of GERD.   Review of systems  Constitutional: + Minimally fluctuating body weight,  current Body mass index is 45.35 kg/m. , + fatigue, no subjective hyperthermia, no subjective hypothermia Eyes: no blurry vision, no xerophthalmia ENT: no sore throat, no nodules palpated in throat, no dysphagia/odynophagia, no hoarseness Cardiovascular: no chest pain, no shortness of breath, no palpitations, no leg swelling Respiratory: no cough, no shortness of breath Gastrointestinal: no nausea/vomiting/diarrhea Musculoskeletal: intermittent leg aches Skin:  no rashes, no hyperemia Neurological: no tremors, no numbness, no tingling, no dizziness Psychiatric: no depression, no anxiety, + mental fog   Objective:   Objective     BP (!) 153/84   Pulse 86   Ht 5\' 4"  (1.626 m)   Wt 264 lb 3.2 oz (119.8 kg)   BMI 45.35 kg/m  Wt Readings from Last 3 Encounters:  06/07/21 264 lb 3.2 oz (119.8 kg)  03/23/21 264 lb 9.6  oz (120 kg)  02/16/21 261 lb (118.4 kg)    BP Readings from Last 3 Encounters:  06/07/21 (!) 153/84  03/23/21 (!) 142/77  02/16/21 (!) 145/86     Physical Exam- Limited  Constitutional:  Body mass index is 45.35 kg/m. , not in acute distress, normal state of mind Eyes:  EOMI, no exophthalmos Neck: Supple Cardiovascular: RRR, no murmurs, rubs, or gallops, no edema Respiratory: Adequate breathing efforts, no crackles, rales, rhonchi, or wheezing Musculoskeletal: no gross deformities, strength intact in all four extremities, no gross restriction of joint movements Skin:  no rashes, no hyperemia Neurological: no tremor with outstretched hands   CMP ( most recent) CMP  No results found for: NA, K, CL, CO2, GLUCOSE, BUN, CREATININE, CALCIUM, PROT, ALBUMIN, AST, ALT, ALKPHOS, BILITOT, GFRNONAA, GFRAA   Diabetic Labs (most recent): No results found for: HGBA1C   Lipid Panel ( most recent) Lipid Panel  No results found for: CHOL, TRIG, HDL, CHOLHDL, VLDL, LDLCALC, LDLDIRECT, LABVLDL     Lab Results  Component Value Date   TSH 12.200 (H) 05/27/2021   TSH 7.750 (H) 03/16/2021   TSH 9.220 (H) 01/21/2021   TSH 65.60 (A) 11/22/2020   TSH 2.78 09/07/2020   FREET4 0.80 (L) 05/27/2021   FREET4 1.37 03/16/2021   FREET4 0.95 01/21/2021     Latest Reference Range & Units 11/22/20 00:00 01/21/21 15:53 03/16/21 15:01 05/27/21 14:33  TSH 0.450 - 4.500 uIU/mL 65.60 ! (E) 9.220 (H) 7.750 (H) 12.200 (H)  T4,Free(Direct) 0.82 - 1.77 ng/dL  0.95 1.37 0.80 (L)  !: Data is abnormal (H): Data is abnormally high (L): Data is  abnormally low (E): External lab result  Assessment & Plan:   ASSESSMENT / PLAN:  1. Hypothyroidism- r/t Hashimoto's thyroiditis -Her previsit thyroid function tests are consistent with slight under-replacement.  She is advised to increase her dose of Levothyroxine to 150 mcg po daily before breakfast.   Patient with long-standing hypothyroidism, on Levothyroxine therapy. On physical exam patient does not have gross goiter, thyroid nodules, or neck compression symptoms.  - We discussed about correct intake of levothyroxine, at fasting, with water, separated by at least 30 minutes from breakfast, and separated by more than 4 hours from calcium, iron, multivitamins, acid reflux medications (PPIs). -Patient is made aware of the fact that thyroid hormone replacement is needed for life, dose to be adjusted by periodic monitoring of thyroid function tests.        I spent 20 minutes in the care of the patient today including review of labs from Thyroid Function, CMP, and other relevant labs ; imaging/biopsy records (current and previous including abstractions from other facilities); face-to-face time discussing  her lab results and symptoms, medications doses, her options of short and long term treatment based on the latest standards of care / guidelines;   and documenting the encounter.  Whitney Reed  participated in the discussions, expressed understanding, and voiced agreement with the above plans.  All questions were answered to her satisfaction. she is encouraged to contact clinic should she have any questions or concerns prior to her return visit.   FOLLOW UP PLAN:  Return in about 3 months (around 09/05/2021) for Thyroid follow up, Previsit labs.    Rayetta Pigg, [facility] [address] [place], [place] [address] Phone: [phone] Fax: [phone]  06/07/2021, 3:49 PM

## 2021-06-07 NOTE — Patient Instructions (Signed)

## 2021-06-15 DIAGNOSIS — B370 Candidal stomatitis: Secondary | ICD-10-CM | POA: Diagnosis not present

## 2021-06-15 DIAGNOSIS — E039 Hypothyroidism, unspecified: Secondary | ICD-10-CM | POA: Diagnosis not present

## 2021-06-15 DIAGNOSIS — K219 Gastro-esophageal reflux disease without esophagitis: Secondary | ICD-10-CM | POA: Diagnosis not present

## 2021-07-05 DIAGNOSIS — M79672 Pain in left foot: Secondary | ICD-10-CM | POA: Diagnosis not present

## 2021-07-05 DIAGNOSIS — K219 Gastro-esophageal reflux disease without esophagitis: Secondary | ICD-10-CM | POA: Diagnosis not present

## 2021-07-05 DIAGNOSIS — R7303 Prediabetes: Secondary | ICD-10-CM | POA: Diagnosis not present

## 2021-07-05 DIAGNOSIS — M7551 Bursitis of right shoulder: Secondary | ICD-10-CM | POA: Diagnosis not present

## 2021-07-05 DIAGNOSIS — E039 Hypothyroidism, unspecified: Secondary | ICD-10-CM | POA: Diagnosis not present

## 2021-07-22 DIAGNOSIS — H35371 Puckering of macula, right eye: Secondary | ICD-10-CM | POA: Diagnosis not present

## 2021-07-22 DIAGNOSIS — H353132 Nonexudative age-related macular degeneration, bilateral, intermediate dry stage: Secondary | ICD-10-CM | POA: Diagnosis not present

## 2021-07-22 DIAGNOSIS — H43813 Vitreous degeneration, bilateral: Secondary | ICD-10-CM | POA: Diagnosis not present

## 2021-07-31 ENCOUNTER — Encounter: Payer: Self-pay | Admitting: Gastroenterology

## 2021-07-31 NOTE — H&P (View-Only) (Signed)
Referring Provider: Rosalee Kaufman, * Primary Care Physician:  Rosalee Kaufman, PA-C Primary GI Physician: Dr. Gala Romney  Chief Complaint  Patient presents with   Diarrhea    About once a week   Gastroesophageal Reflux    Doing okay    HPI:   Whitney Reed is a 71 y.o. female with history of GERD, CT confirmed diverticulitis in 2021, follow-up colonoscopy in June 2021 with pancolonic diverticulosis and nonbleeding hemorrhoids, presenting today for follow-up of dyspepsia.   Last seen in our office 06/29/2020 for dyspepsia, GERD.  She reported several week history of intermittent gnawing epigastric pain not necessarily affected by meals or bowel movements.  Typical reflux well-controlled on Protonix 40 mg twice daily.  Bristol 4-5 stools occurring every 2 to 3 days.  Admitted to taking at least 2 g of ibuprofen daily, often up to 3 g in a single day.  Additionally, taking 2-3 Goody powders daily for various aches and pains including headache.  Noted history of H. pylori without confirmed eradication.  Recent labs with normal LFTs.  Prior celiac screen normal, but patient felt she did better on a gluten-free diet.  Recommended discontinuing ibuprofen and Goody powders, continue Protonix twice daily, will need eventual H. pylori stool testing, if dyspepsia does not improve, would need EGD, start Benefiber to help with bowel regularity.  Recommended follow-up in 4 to 6 weeks, but patient has canceled multiple follow-up visits.   H. pylori stool test completed February 2022 with a negative result.    Today: Reflux is not adequately controlled on Protonix 40 mg twice daily.  Thinks she may have been on Prilosec many years ago.  She is having burning in her esophagus several days a week taking Rolaids as needed.  Also noted burning all the way down her esophagus with drinking soda, so she stopped this.  She has been having trouble with recurrent thrush in her mouth.  She is on  Diflucan twice and a mouthwash most recently.  Medications helped, but symptoms returned.  She was last treated about 3 weeks ago.  Denies dysphagia, nausea, vomiting.  Continues with abdominal pain, primarily epigastric area.  Symptoms are occurring daily, sometimes after eating, but other times not.  Gluten and dairy products will worsen her abdominal pain.  Dairy will also cause her to have loose stools so she avoids it.  She does better with a gluten-free diet, but has been slack on this.  Has trouble with constipation.  BMs once every 4-5 days. Often Bristol #1. More abdominal pain on the days she has constipation. Benefiber caused abdominal discomfort. No brbpr or melena.   NSAIDs: 4 ibuprofen yesterday. Takes this about 3 days a week. No Goody powders.  When taking too much ibuprofen, it causes worsening upper abdominal pain.    Trying to lose weight.  [place].  Past Medical History:  Diagnosis Date   Anxiety    Arthritis    osteo   Chronic back pain    Constipation    GERD (gastroesophageal reflux disease)    Hypothyroidism    PONV (postoperative nausea and vomiting)     Past Surgical History:  Procedure Laterality Date   ABDOMINAL HYSTERECTOMY     CATARACT EXTRACTION W/PHACO Right 10/25/2020   Procedure: CATARACT EXTRACTION PHACO AND INTRAOCULAR LENS PLACEMENT RIGHT EYE;  Surgeon: Baruch Goldmann, MD;  Location: [facility];  Service: Ophthalmology;  Laterality: Right;  right CDE=4.59   CATARACT EXTRACTION W/PHACO Left 11/08/2020  Procedure: CATARACT EXTRACTION PHACO AND INTRAOCULAR LENS PLACEMENT LEFT EYE;  Surgeon: Baruch Goldmann, MD;  Location: [facility];  Service: Ophthalmology;  Laterality: Left;  left CDE=5.37   CHOLECYSTECTOMY     COLONOSCOPY     COLONOSCOPY WITH PROPOFOL N/A 12/02/2019   Surgeon: Daneil Dolin, MD; pancolonic diverticulosis, nonbleeding hemorrhoids.  No recommendations to repeat due to age.   ESOPHAGOGASTRODUODENOSCOPY     PILONIDAL CYST  EXCISION     TONSILLECTOMY      Current Outpatient Medications  Medication Sig Dispense Refill   acetaminophen (TYLENOL) 500 MG tablet Take 1,000 mg by mouth 3 (three) times daily as needed for moderate pain.     albuterol (VENTOLIN HFA) 108 (90 Base) MCG/ACT inhaler Inhale 1-2 puffs into the lungs every 6 (six) hours as needed for wheezing or shortness of breath.     ALPRAZolam (XANAX) 1 MG tablet Take 1 mg by mouth 3 (three) times daily as needed for anxiety.      dexlansoprazole (DEXILANT) 60 MG capsule Take 1 capsule (60 mg total) by mouth daily. 30 capsule 3   DULoxetine (CYMBALTA) 30 MG capsule Take 30 mg by mouth 3 (three) times daily.     fluconazole (DIFLUCAN) 200 MG tablet Take 1 tablet (200 mg total) by mouth daily. 7 tablet 0   levothyroxine (SYNTHROID) 150 MCG tablet Take 1 tablet (150 mcg total) by mouth daily before breakfast. 90 tablet 1   Polyethyl Glycol-Propyl Glycol (LUBRICANT EYE DROPS) 0.4-0.3 % SOLN Place 1 drop into both eyes 3 (three) times daily as needed (dry/irritated eyes.).     No current facility-administered medications for this visit.    Allergies as of 08/01/2021 - Review Complete 08/01/2021  Allergen Reaction Noted   Erythromycin Rash     Family History  Problem Relation Age of Onset   Colon cancer Neg Hx     Social History   Socioeconomic History   Marital status: Married    Spouse name: Not on file   Number of children: Not on file   Years of education: Not on file   Highest education level: Not on file  Occupational History   Not on file  Tobacco Use   Smoking status: Never   Smokeless tobacco: Never  Vaping Use   Vaping Use: Never used  Substance and Sexual Activity   Alcohol use: Never   Drug use: Never   Sexual activity: Yes  Other Topics Concern   Not on file  Social History Narrative   Not on file   Social Determinants of Health   Financial Resource Strain: Not on file  Food Insecurity: Not on file  Transportation  Needs: Not on file  Physical Activity: Not on file  Stress: Not on file  Social Connections: Not on file    Review of Systems: Gen: Denies fever, chills, cold or flulike symptoms, presyncope, syncope. CV: Denies chest pain, palpitations. Resp: Denies dyspnea at rest, cough. GI: See HPI Heme: See HPI  Physical Exam: BP (!) 174/91    Pulse 98    Temp (!) 97.1 F (36.2 C)    Ht 5\' 4"  (1.626 m)    Wt 256 lb 12.8 oz (116.5 kg)    BMI 44.08 kg/m  General:   Alert and oriented. No distress noted. Pleasant and cooperative.  Head:  Normocephalic and atraumatic. Eyes:  Conjuctiva clear without scleral icterus. Heart:  S1, S2 present without murmurs appreciated. Lungs:  Clear to auscultation bilaterally. No wheezes, rales, or rhonchi.  No distress.  Abdomen:  +BS, soft, non-tender and non-distended. No rebound or guarding. No HSM or masses noted. Msk:  Symmetrical without gross deformities. Normal posture. Extremities:  Without edema. Neurologic:  Alert and  oriented x4 Psych:  Normal mood and affect.   Assessment:  71 year old female with history of GERD, dyspepsia, diverticulitis in 2021 with follow-up colonoscopy in June 2021 but pancolonic diverticulosis and nonbleeding hemorrhoids with recommendations to repeat colonoscopy, presenting today for follow-up of dyspepsia/upper abdominal pain.  Upper abdominal pain: Intermittent upper abdominal, primarily epigastric for greater than 1 year.  Likely multifactorial in the setting of uncontrolled GERD, chronic NSAID use with possible gastritis, esophagitis, duodenitis, PUD.  H. pylori stool antigen test negative in February 2022. Lactose and possible gluten intolerance as well as and constipation with possible IBS likely influencing symptoms. She also reports history of recurrent oral thrush, can't rule out esophageal candidiasis.    Recommend EGD with duodenal biopsies for further evaluation.   GERD:  Uncontrolled on Protonix 40 mg BID. She  has been on this for quite sometime and may be losing response. Body habitus likely playing a role and she is working on this, recently joined [employer] and has lost about 8 lbs. Chronic NSAID use may also be influencing refractory symptoms. We will try to change her PPI and arrange EGD for further evaluation.   Constipation:  Likely influenced by hypothyroidism and may have component of IBS-C. No alarm symptoms. Colonoscopy up to date. Will try Linzess 72 mcg daily. Samples provided.   Loose stools:  Fairly infrequent and likely secondary to dietary intolerances, lactose and gluten. Could also be related to IBS. No alarm symptoms. Planning for EGD in the near future. Recommend duodenal biopsies.  Oral candidiasis: Patient reports recurrent oral thrush.  Reports has been on Diflucan x2, though the regimen is unclear to me, as well as nystatin.  Reports symptoms improved with treatment, but recur.  She was last treated about 3 weeks ago.  On exam, appears she has oral thrush again.  We will treat with a course of Diflucan.  EGD in the near future will help to evaluate for esophageal candidiasis as well.    Plan:  EGD with duodenal biopsies with propofol with Dr. Abbey Chatters in the near future. The risks, benefits, and alternatives have been discussed with the patient in detail along with scheduling procedure with Dr. Abbey Chatters in the absence of Dr. Gala Romney. The patient states understanding and desires to proceed. ASA 3 Stop Protonix and start Dexilant 60 mg daily. Reinforced GERD diet/lifestyle.  Separate written instructions provided. Discontinue NSAIDs. Avoid known dietary triggers including gluten and dairy. Linzess 72 mcg daily 30 minutes before first meal.  Samples provided.  Requested progress report in 1 week. Diflucan 200 mg daily x7 days. Follow-up after EGD.  Advised to call sooner with any questions or concerns.   Aliene Altes, PA-C [facility] 08/01/2021

## 2021-07-31 NOTE — Progress Notes (Signed)
Referring Provider: Rosalee Kaufman, * Primary Care Physician:  Rosalee Kaufman, PA-C Primary GI Physician: Dr. Gala Romney  Chief Complaint  Patient presents with   Diarrhea    About once a week   Gastroesophageal Reflux    Doing okay    HPI:   Whitney Reed is a 71 y.o. female with history of GERD, CT confirmed diverticulitis in 2021, follow-up colonoscopy in June 2021 with pancolonic diverticulosis and nonbleeding hemorrhoids, presenting today for follow-up of dyspepsia.   Last seen in our office 06/29/2020 for dyspepsia, GERD.  She reported several week history of intermittent gnawing epigastric pain not necessarily affected by meals or bowel movements.  Typical reflux well-controlled on Protonix 40 mg twice daily.  Bristol 4-5 stools occurring every 2 to 3 days.  Admitted to taking at least 2 g of ibuprofen daily, often up to 3 g in a single day.  Additionally, taking 2-3 Goody powders daily for various aches and pains including headache.  Noted history of H. pylori without confirmed eradication.  Recent labs with normal LFTs.  Prior celiac screen normal, but patient felt she did better on a gluten-free diet.  Recommended discontinuing ibuprofen and Goody powders, continue Protonix twice daily, will need eventual H. pylori stool testing, if dyspepsia does not improve, would need EGD, start Benefiber to help with bowel regularity.  Recommended follow-up in 4 to 6 weeks, but patient has canceled multiple follow-up visits.   H. pylori stool test completed February 2022 with a negative result.    Today: Reflux is not adequately controlled on Protonix 40 mg twice daily.  Thinks she may have been on Prilosec many years ago.  She is having burning in her esophagus several days a week taking Rolaids as needed.  Also noted burning all the way down her esophagus with drinking soda, so she stopped this.  She has been having trouble with recurrent thrush in her mouth.  She is on  Diflucan twice and a mouthwash most recently.  Medications helped, but symptoms returned.  She was last treated about 3 weeks ago.  Denies dysphagia, nausea, vomiting.  Continues with abdominal pain, primarily epigastric area.  Symptoms are occurring daily, sometimes after eating, but other times not.  Gluten and dairy products will worsen her abdominal pain.  Dairy will also cause her to have loose stools so she avoids it.  She does better with a gluten-free diet, but has been slack on this.  Has trouble with constipation.  BMs once every 4-5 days. Often Bristol #1. More abdominal pain on the days she has constipation. Benefiber caused abdominal discomfort. No brbpr or melena.   NSAIDs: 4 ibuprofen yesterday. Takes this about 3 days a week. No Goody powders.  When taking too much ibuprofen, it causes worsening upper abdominal pain.    Trying to lose weight.  [place].  Past Medical History:  Diagnosis Date   Anxiety    Arthritis    osteo   Chronic back pain    Constipation    GERD (gastroesophageal reflux disease)    Hypothyroidism    PONV (postoperative nausea and vomiting)     Past Surgical History:  Procedure Laterality Date   ABDOMINAL HYSTERECTOMY     CATARACT EXTRACTION W/PHACO Right 10/25/2020   Procedure: CATARACT EXTRACTION PHACO AND INTRAOCULAR LENS PLACEMENT RIGHT EYE;  Surgeon: Baruch Goldmann, MD;  Location: [facility];  Service: Ophthalmology;  Laterality: Right;  right CDE=4.59   CATARACT EXTRACTION W/PHACO Left 11/08/2020  Procedure: CATARACT EXTRACTION PHACO AND INTRAOCULAR LENS PLACEMENT LEFT EYE;  Surgeon: Baruch Goldmann, MD;  Location: [facility];  Service: Ophthalmology;  Laterality: Left;  left CDE=5.37   CHOLECYSTECTOMY     COLONOSCOPY     COLONOSCOPY WITH PROPOFOL N/A 12/02/2019   Surgeon: Daneil Dolin, MD; pancolonic diverticulosis, nonbleeding hemorrhoids.  No recommendations to repeat due to age.   ESOPHAGOGASTRODUODENOSCOPY     PILONIDAL CYST  EXCISION     TONSILLECTOMY      Current Outpatient Medications  Medication Sig Dispense Refill   acetaminophen (TYLENOL) 500 MG tablet Take 1,000 mg by mouth 3 (three) times daily as needed for moderate pain.     albuterol (VENTOLIN HFA) 108 (90 Base) MCG/ACT inhaler Inhale 1-2 puffs into the lungs every 6 (six) hours as needed for wheezing or shortness of breath.     ALPRAZolam (XANAX) 1 MG tablet Take 1 mg by mouth 3 (three) times daily as needed for anxiety.      dexlansoprazole (DEXILANT) 60 MG capsule Take 1 capsule (60 mg total) by mouth daily. 30 capsule 3   DULoxetine (CYMBALTA) 30 MG capsule Take 30 mg by mouth 3 (three) times daily.     fluconazole (DIFLUCAN) 200 MG tablet Take 1 tablet (200 mg total) by mouth daily. 7 tablet 0   levothyroxine (SYNTHROID) 150 MCG tablet Take 1 tablet (150 mcg total) by mouth daily before breakfast. 90 tablet 1   Polyethyl Glycol-Propyl Glycol (LUBRICANT EYE DROPS) 0.4-0.3 % SOLN Place 1 drop into both eyes 3 (three) times daily as needed (dry/irritated eyes.).     No current facility-administered medications for this visit.    Allergies as of 08/01/2021 - Review Complete 08/01/2021  Allergen Reaction Noted   Erythromycin Rash     Family History  Problem Relation Age of Onset   Colon cancer Neg Hx     Social History   Socioeconomic History   Marital status: Married    Spouse name: Not on file   Number of children: Not on file   Years of education: Not on file   Highest education level: Not on file  Occupational History   Not on file  Tobacco Use   Smoking status: Never   Smokeless tobacco: Never  Vaping Use   Vaping Use: Never used  Substance and Sexual Activity   Alcohol use: Never   Drug use: Never   Sexual activity: Yes  Other Topics Concern   Not on file  Social History Narrative   Not on file   Social Determinants of Health   Financial Resource Strain: Not on file  Food Insecurity: Not on file  Transportation  Needs: Not on file  Physical Activity: Not on file  Stress: Not on file  Social Connections: Not on file    Review of Systems: Gen: Denies fever, chills, cold or flulike symptoms, presyncope, syncope. CV: Denies chest pain, palpitations. Resp: Denies dyspnea at rest, cough. GI: See HPI Heme: See HPI  Physical Exam: BP (!) 174/91    Pulse 98    Temp (!) 97.1 F (36.2 C)    Ht 5\' 4"  (1.626 m)    Wt 256 lb 12.8 oz (116.5 kg)    BMI 44.08 kg/m  General:   Alert and oriented. No distress noted. Pleasant and cooperative.  Head:  Normocephalic and atraumatic. Eyes:  Conjuctiva clear without scleral icterus. Heart:  S1, S2 present without murmurs appreciated. Lungs:  Clear to auscultation bilaterally. No wheezes, rales, or rhonchi.  No distress.  Abdomen:  +BS, soft, non-tender and non-distended. No rebound or guarding. No HSM or masses noted. Msk:  Symmetrical without gross deformities. Normal posture. Extremities:  Without edema. Neurologic:  Alert and  oriented x4 Psych:  Normal mood and affect.   Assessment:  71 year old female with history of GERD, dyspepsia, diverticulitis in 2021 with follow-up colonoscopy in June 2021 but pancolonic diverticulosis and nonbleeding hemorrhoids with recommendations to repeat colonoscopy, presenting today for follow-up of dyspepsia/upper abdominal pain.  Upper abdominal pain: Intermittent upper abdominal, primarily epigastric for greater than 1 year.  Likely multifactorial in the setting of uncontrolled GERD, chronic NSAID use with possible gastritis, esophagitis, duodenitis, PUD.  H. pylori stool antigen test negative in February 2022. Lactose and possible gluten intolerance as well as and constipation with possible IBS likely influencing symptoms. She also reports history of recurrent oral thrush, can't rule out esophageal candidiasis.    Recommend EGD with duodenal biopsies for further evaluation.   GERD:  Uncontrolled on Protonix 40 mg BID. She  has been on this for quite sometime and may be losing response. Body habitus likely playing a role and she is working on this, recently joined [employer] and has lost about 8 lbs. Chronic NSAID use may also be influencing refractory symptoms. We will try to change her PPI and arrange EGD for further evaluation.   Constipation:  Likely influenced by hypothyroidism and may have component of IBS-C. No alarm symptoms. Colonoscopy up to date. Will try Linzess 72 mcg daily. Samples provided.   Loose stools:  Fairly infrequent and likely secondary to dietary intolerances, lactose and gluten. Could also be related to IBS. No alarm symptoms. Planning for EGD in the near future. Recommend duodenal biopsies.  Oral candidiasis: Patient reports recurrent oral thrush.  Reports has been on Diflucan x2, though the regimen is unclear to me, as well as nystatin.  Reports symptoms improved with treatment, but recur.  She was last treated about 3 weeks ago.  On exam, appears she has oral thrush again.  We will treat with a course of Diflucan.  EGD in the near future will help to evaluate for esophageal candidiasis as well.    Plan:  EGD with duodenal biopsies with propofol with Dr. Abbey Chatters in the near future. The risks, benefits, and alternatives have been discussed with the patient in detail along with scheduling procedure with Dr. Abbey Chatters in the absence of Dr. Gala Romney. The patient states understanding and desires to proceed. ASA 3 Stop Protonix and start Dexilant 60 mg daily. Reinforced GERD diet/lifestyle.  Separate written instructions provided. Discontinue NSAIDs. Avoid known dietary triggers including gluten and dairy. Linzess 72 mcg daily 30 minutes before first meal.  Samples provided.  Requested progress report in 1 week. Diflucan 200 mg daily x7 days. Follow-up after EGD.  Advised to call sooner with any questions or concerns.   Aliene Altes, PA-C [facility] 08/01/2021

## 2021-08-01 ENCOUNTER — Encounter: Payer: Self-pay | Admitting: Gastroenterology

## 2021-08-01 ENCOUNTER — Other Ambulatory Visit: Payer: Self-pay

## 2021-08-01 ENCOUNTER — Ambulatory Visit: Payer: Medicare Other | Admitting: Gastroenterology

## 2021-08-01 VITALS — BP 174/91 | HR 98 | Temp 97.1°F | Ht 64.0 in | Wt 256.8 lb

## 2021-08-01 DIAGNOSIS — R1010 Upper abdominal pain, unspecified: Secondary | ICD-10-CM | POA: Diagnosis not present

## 2021-08-01 DIAGNOSIS — R195 Other fecal abnormalities: Secondary | ICD-10-CM | POA: Diagnosis not present

## 2021-08-01 DIAGNOSIS — B370 Candidal stomatitis: Secondary | ICD-10-CM

## 2021-08-01 DIAGNOSIS — K219 Gastro-esophageal reflux disease without esophagitis: Secondary | ICD-10-CM

## 2021-08-01 DIAGNOSIS — K5900 Constipation, unspecified: Secondary | ICD-10-CM | POA: Diagnosis not present

## 2021-08-01 MED ORDER — DEXLANSOPRAZOLE 60 MG PO CPDR: 60.0000 mg | DELAYED_RELEASE_CAPSULE | Freq: Every day | ORAL | 3 refills | Status: DC

## 2021-08-01 MED ORDER — FLUCONAZOLE 200 MG PO TABS: 200.0000 mg | ORAL_TABLET | Freq: Every day | ORAL | 0 refills | Status: DC

## 2021-08-01 NOTE — Patient Instructions (Signed)
We will arrange for you to have an upper endoscopy in the near future with Dr. Abbey Chatters.  Stop Protonix and start Dexilant 60 mg daily.  Follow a GERD diet:  Avoid fried, fatty, greasy, spicy, citrus foods. Avoid caffeine and carbonated beverages. Avoid chocolate. Try eating 4-6 small meals a day rather than 3 large meals. Do not eat within 3 hours of laying down. Prop head of bed up on wood or bricks to create a 6 inch incline.  Avoid known dietary triggers of your abdominal pain and reflux.  I recommend you discontinue all NSAID products including ibuprofen, Aleve, Advil, BC powders, Goody powders, and anything that says "NSAID" on the package.  For constipation: Start Linzess 72 mcg daily 30 minutes before your first meal.  We are providing you with samples.  Please call with a progress report in 1 week.  If this works well for you, I will send in a prescription.  For oral thrush: Take Diflucan 200 mg daily x7 days.  If you still have symptoms at the end of 7 days, please let me know.  We will follow-up with you in the office after your EGD.  Do not hesitate to call if you have any questions or concerns prior to next visit.  It was a pleasure meeting you today!  Aliene Altes, PA-C [facility]

## 2021-08-03 DIAGNOSIS — R059 Cough, unspecified: Secondary | ICD-10-CM | POA: Diagnosis not present

## 2021-08-19 NOTE — Patient Instructions (Signed)
Whitney Reed  08/19/2021     @PREFPERIOPPHARMACY @   Your procedure is scheduled on  08/25/2021.   Report to [facility] at  [phone]  A.M.   Call this number if you have problems the morning of surgery:  [phone]   Remember:  Follow the diet instructions given to you by the office.    Take these medicines the morning of surgery with A SIP OF WATER      xanax(if needed), cymbalta, levothyroxine, protonix.    Use your inhaler before you come and bring your recu inhaler with you.     Do not wear jewelry, make-up or nail polish.  Do not wear lotions, powders, or perfumes, or deodorant.  Do not shave 48 hours prior to surgery.  Men may shave face and neck.  Do not bring valuables to the hospital.  [facility] is not responsible for any belongings or valuables.  Contacts, dentures or bridgework may not be worn into surgery.  Leave your suitcase in the car.  After surgery it may be brought to your room.  For patients admitted to the hospital, discharge time will be determined by your treatment team.  Patients discharged the day of surgery will not be allowed to drive home and must have someone with them for 24 hours.    Special instructions:   DO NOT smoke tobacco or vape for 24 hours before your procedure.  Please read over the following fact sheets that you were given. Anesthesia Post-op Instructions and Care and Recovery After Surgery      Upper Endoscopy, Adult, Care After This sheet gives you information about how to care for yourself after your procedure. Your health care provider may also give you more specific instructions. If you have problems or questions, contact your health care provider. What can I expect after the procedure? After the procedure, it is common to have: A sore throat. Mild stomach pain or discomfort. Bloating. Nausea. Follow these instructions at home:  Follow instructions from your health care provider about what to eat or  drink after your procedure. Return to your normal activities as told by your health care provider. Ask your health care provider what activities are safe for you. Take over-the-counter and prescription medicines only as told by your health care provider. If you were given a sedative during the procedure, it can affect you for several hours. Do not drive or operate machinery until your health care provider says that it is safe. Keep all follow-up visits as told by your health care provider. This is important. Contact a health care provider if you have: A sore throat that lasts longer than one day. Trouble swallowing. Get help right away if: You vomit blood or your vomit looks like coffee grounds. You have: A fever. Bloody, black, or tarry stools. A severe sore throat or you cannot swallow. Difficulty breathing. Severe pain in your chest or abdomen. Summary After the procedure, it is common to have a sore throat, mild stomach discomfort, bloating, and nausea. If you were given a sedative during the procedure, it can affect you for several hours. Do not drive or operate machinery until your health care provider says that it is safe. Follow instructions from your health care provider about what to eat or drink after your procedure. Return to your normal activities as told by your health care provider. This information is not intended to replace advice given to you by your health care provider.  Make sure you discuss any questions you have with your health care provider. Document Revised: 04/25/2019 Document Reviewed: 11/19/2017 Elsevier Patient Education  2022 [place] After This sheet gives you information about how to care for yourself after your procedure. Your health care provider may also give you more specific instructions. If you have problems or questions, contact your health care provider. What can I expect after the procedure? After the procedure,  it is common to have: Tiredness. Forgetfulness about what happened after the procedure. Impaired judgment for important decisions. Nausea or vomiting. Some difficulty with balance. Follow these instructions at home: For the time period you were told by your health care provider:   Rest as needed. Do not participate in activities where you could fall or become injured. Do not drive or use machinery. Do not drink alcohol. Do not take sleeping pills or medicines that cause drowsiness. Do not make important decisions or sign legal documents. Do not take care of children on your own. Eating and drinking Follow the diet that is recommended by your health care provider. Drink enough fluid to keep your urine pale yellow. If you vomit: Drink water, juice, or soup when you can drink without vomiting. Make sure you have little or no nausea before eating solid foods. General instructions Have a responsible adult stay with you for the time you are told. It is important to have someone help care for you until you are awake and alert. Take over-the-counter and prescription medicines only as told by your health care provider. If you have sleep apnea, surgery and certain medicines can increase your risk for breathing problems. Follow instructions from your health care provider about wearing your sleep device: Anytime you are sleeping, including during daytime naps. While taking prescription pain medicines, sleeping medicines, or medicines that make you drowsy. Avoid smoking. Keep all follow-up visits as told by your health care provider. This is important. Contact a health care provider if: You keep feeling nauseous or you keep vomiting. You feel light-headed. You are still sleepy or having trouble with balance after 24 hours. You develop a rash. You have a fever. You have redness or swelling around the IV site. Get help right away if: You have trouble breathing. You have new-onset confusion at  home. Summary For several hours after your procedure, you may feel tired. You may also be forgetful and have poor judgment. Have a responsible adult stay with you for the time you are told. It is important to have someone help care for you until you are awake and alert. Rest as told. Do not drive or operate machinery. Do not drink alcohol or take sleeping pills. Get help right away if you have trouble breathing, or if you suddenly become confused. This information is not intended to replace advice given to you by your health care provider. Make sure you discuss any questions you have with your health care provider. Document Revised: 03/04/2020 Document Reviewed: 05/22/2019 Elsevier Patient Education  2022 [employer].

## 2021-08-22 ENCOUNTER — Encounter (HOSPITAL_COMMUNITY)
Admission: RE | Admit: 2021-08-22 | Discharge: 2021-08-22 | Disposition: A | Discharge status: Home / Self Care | Payer: Medicare Other | Source: Ambulatory Visit | Attending: Internal Medicine | Admitting: Internal Medicine

## 2021-08-24 DIAGNOSIS — M9901 Segmental and somatic dysfunction of cervical region: Secondary | ICD-10-CM | POA: Diagnosis not present

## 2021-08-24 DIAGNOSIS — M9903 Segmental and somatic dysfunction of lumbar region: Secondary | ICD-10-CM | POA: Diagnosis not present

## 2021-08-24 DIAGNOSIS — M9902 Segmental and somatic dysfunction of thoracic region: Secondary | ICD-10-CM | POA: Diagnosis not present

## 2021-08-24 DIAGNOSIS — S338XXA Sprain of other parts of lumbar spine and pelvis, initial encounter: Secondary | ICD-10-CM | POA: Diagnosis not present

## 2021-08-24 DIAGNOSIS — S233XXA Sprain of ligaments of thoracic spine, initial encounter: Secondary | ICD-10-CM | POA: Diagnosis not present

## 2021-08-24 DIAGNOSIS — S134XXA Sprain of ligaments of cervical spine, initial encounter: Secondary | ICD-10-CM | POA: Diagnosis not present

## 2021-08-25 ENCOUNTER — Ambulatory Visit (HOSPITAL_BASED_OUTPATIENT_CLINIC_OR_DEPARTMENT_OTHER): Payer: Medicare Other | Admitting: Anesthesiology

## 2021-08-25 ENCOUNTER — Other Ambulatory Visit: Payer: Self-pay

## 2021-08-25 ENCOUNTER — Ambulatory Visit (HOSPITAL_COMMUNITY)
Admission: RE | Admit: 2021-08-25 | Discharge: 2021-08-25 | Disposition: A | Discharge status: Home / Self Care | Payer: Medicare Other | Attending: Internal Medicine | Admitting: Internal Medicine

## 2021-08-25 ENCOUNTER — Telehealth: Payer: Self-pay | Admitting: *Deleted

## 2021-08-25 ENCOUNTER — Encounter (HOSPITAL_COMMUNITY): Payer: Self-pay

## 2021-08-25 ENCOUNTER — Encounter (HOSPITAL_COMMUNITY)
Admission: RE | Disposition: A | Discharge status: Home / Self Care | Payer: Self-pay | Source: Home / Self Care | Attending: Internal Medicine

## 2021-08-25 ENCOUNTER — Ambulatory Visit (HOSPITAL_COMMUNITY): Payer: Medicare Other | Admitting: Anesthesiology

## 2021-08-25 DIAGNOSIS — R1013 Epigastric pain: Secondary | ICD-10-CM | POA: Insufficient documentation

## 2021-08-25 DIAGNOSIS — E039 Hypothyroidism, unspecified: Secondary | ICD-10-CM

## 2021-08-25 DIAGNOSIS — K449 Diaphragmatic hernia without obstruction or gangrene: Secondary | ICD-10-CM

## 2021-08-25 DIAGNOSIS — F32A Depression, unspecified: Secondary | ICD-10-CM | POA: Diagnosis not present

## 2021-08-25 DIAGNOSIS — F419 Anxiety disorder, unspecified: Secondary | ICD-10-CM | POA: Insufficient documentation

## 2021-08-25 DIAGNOSIS — R1010 Upper abdominal pain, unspecified: Secondary | ICD-10-CM | POA: Diagnosis not present

## 2021-08-25 DIAGNOSIS — K222 Esophageal obstruction: Secondary | ICD-10-CM | POA: Diagnosis not present

## 2021-08-25 DIAGNOSIS — Z6841 Body Mass Index (BMI) 40.0 and over, adult: Secondary | ICD-10-CM | POA: Insufficient documentation

## 2021-08-25 DIAGNOSIS — K2970 Gastritis, unspecified, without bleeding: Secondary | ICD-10-CM | POA: Insufficient documentation

## 2021-08-25 DIAGNOSIS — K219 Gastro-esophageal reflux disease without esophagitis: Secondary | ICD-10-CM | POA: Diagnosis not present

## 2021-08-25 HISTORY — PX: ESOPHAGOGASTRODUODENOSCOPY (EGD) WITH PROPOFOL: SHX5813

## 2021-08-25 HISTORY — PX: BIOPSY: SHX5522

## 2021-08-25 SURGERY — ESOPHAGOGASTRODUODENOSCOPY (EGD) WITH PROPOFOL
Anesthesia: General

## 2021-08-25 MED ORDER — LACTATED RINGERS IV SOLN: INTRAVENOUS | Status: DC

## 2021-08-25 MED ORDER — PROPOFOL 500 MG/50ML IV EMUL
INTRAVENOUS | Status: DC | PRN
  Administered 2021-08-25: 200 ug/kg/min via INTRAVENOUS

## 2021-08-25 MED ORDER — LIDOCAINE 2% (20 MG/ML) 5 ML SYRINGE
INTRAMUSCULAR | Status: DC | PRN
  Administered 2021-08-25: 50 mg via INTRAVENOUS

## 2021-08-25 MED ORDER — PROPOFOL 10 MG/ML IV BOLUS
INTRAVENOUS | Status: DC | PRN
  Administered 2021-08-25: 30 mg via INTRAVENOUS
  Administered 2021-08-25: 70 mg via INTRAVENOUS

## 2021-08-25 NOTE — Op Note (Signed)
[facility] Patient Name: Whitney Reed Procedure Date: 08/25/2021 8:33 AM MRN: [id number] Date of Birth: January 09, 1951 Attending MD: Elon Alas. Abbey Chatters DO CSN: [id number] Age: 71 Admit Type: Outpatient Procedure:                Upper GI endoscopy Indications:              Epigastric abdominal pain, Heartburn, Diarrhea Providers:                Elon Alas. Abbey Chatters, DO, Hughie Closs RN, RN, Tammy                            Vaught, RN, Suzan Garibaldi. Risa Grill, Technician Referring MD:              Medicines:                See the Anesthesia note for documentation of the                            administered medications Complications:            No immediate complications. Estimated Blood Loss:     Estimated blood loss was minimal. Procedure:                Pre-Anesthesia Assessment:                           - The anesthesia plan was to use monitored                            anesthesia care (MAC).                           After obtaining informed consent, the endoscope was                            passed under direct vision. Throughout the                            procedure, the patient's blood pressure, pulse, and                            oxygen saturations were monitored continuously. The                            [id number]-H190 ([id number]) scope was introduced through the                            mouth, and advanced to the second part of duodenum.                            The upper GI endoscopy was accomplished without                            difficulty. The patient tolerated the procedure  well. Scope In: 8:48:45 AM Scope Out: 8:54:05 AM Total Procedure Duration: 0 hours 5 minutes 20 seconds  Findings:      A medium-sized hiatal hernia was present.      A mild Schatzki ring was found in the distal esophagus.      Patchy mild inflammation characterized by erythema was found in the       gastric body. Biopsies were taken with a cold forceps  for Helicobacter       pylori testing.      The duodenal bulb, first portion of the duodenum and second portion of       the duodenum were normal. Biopsies for histology were taken with a cold       forceps for evaluation of celiac disease. Impression:               - Medium-sized hiatal hernia.                           - Mild Schatzki ring.                           - Gastritis. Biopsied.                           - Normal duodenal bulb, first portion of the                            duodenum and second portion of the duodenum.                            Biopsied. Moderate Sedation:      Per Anesthesia Care Recommendation:           - Patient has a contact number available for                            emergencies. The signs and symptoms of potential                            delayed complications were discussed with the                            patient. Return to normal activities tomorrow.                            Written discharge instructions were provided to the                            patient.                           - Resume previous diet.                           - Continue present medications.                           - Await pathology results.                           -  Return to GI clinic in 4 weeks.                           - Patient unable to afford Dexilant. Will try for                            patient assistance Procedure Code(s):        --- Professional ---                           [phone], Esophagogastroduodenoscopy, flexible,                            transoral; with biopsy, single or multiple Diagnosis Code(s):        --- Professional ---                           K44.9, Diaphragmatic hernia without obstruction or                            gangrene                           K22.2, Esophageal obstruction                           K29.70, Gastritis, unspecified, without bleeding                           R10.13, Epigastric pain                            R12, Heartburn                           R19.7, Diarrhea, unspecified CPT copyright 2019 American Medical Association. All rights reserved. The codes documented in this report are preliminary and upon coder review may  be revised to meet current compliance requirements. Elon Alas. Abbey Chatters, DO [name], DO 08/25/2021 8:59:36 AM This report has been signed electronically. Number of Addenda: 0

## 2021-08-25 NOTE — Anesthesia Preprocedure Evaluation (Signed)
Anesthesia Evaluation  Patient identified by MRN, date of birth, ID band Patient awake    Reviewed: Allergy & Precautions, NPO status , Patient's Chart, lab work & pertinent test results  History of Anesthesia Complications (+) PONV and history of anesthetic complications  Airway Mallampati: II  TM Distance: >3 FB Neck ROM: Full    Dental  (+) Dental Advisory Given, Missing, Chipped   Pulmonary    Pulmonary exam normal breath sounds clear to auscultation       Cardiovascular Exercise Tolerance: Good Normal cardiovascular exam Rhythm:Regular Rate:Normal     Neuro/Psych  Headaches, PSYCHIATRIC DISORDERS Anxiety Depression    GI/Hepatic Neg liver ROS, GERD (gastrparesis)  Medicated,Oral candidiasis    Endo/Other  Hypothyroidism Morbid obesity  Renal/GU negative Renal ROS     Musculoskeletal  (+) Arthritis , Osteoarthritis,    Abdominal   Peds  Hematology negative hematology ROS (+)   Anesthesia Other Findings Chronic back pain  Reproductive/Obstetrics                            Anesthesia Physical Anesthesia Plan  ASA: 3  Anesthesia Plan: General   Post-op Pain Management:    Induction:   PONV Risk Score and Plan: TIVA  Airway Management Planned: Nasal Cannula and Natural Airway  Additional Equipment:   Intra-op Plan:   Post-operative Plan:   Informed Consent: I have reviewed the patients History and Physical, chart, labs and discussed the procedure including the risks, benefits and alternatives for the proposed anesthesia with the patient or authorized representative who has indicated his/her understanding and acceptance.     Dental advisory given  Plan Discussed with: CRNA and Surgeon  Anesthesia Plan Comments:        Anesthesia Quick Evaluation

## 2021-08-25 NOTE — Telephone Encounter (Signed)
Had EGD today.  Pt called in and said thrush has come back in mouth.  Pt wants to know if she can get something sent in for it.  Uses [facility].  Routing to Dr. Abbey Chatters.  Dena:   Routing to you as well as FYI in my absence tomorrow on case pt calls in tomorrow.

## 2021-08-25 NOTE — Discharge Instructions (Addendum)
EGD Discharge instructions Please read the instructions outlined below and refer to this sheet in the next few weeks. These discharge instructions provide you with general information on caring for yourself after you leave the hospital. Your doctor may also give you specific instructions. While your treatment has been planned according to the most current medical practices available, unavoidable complications occasionally occur. If you have any problems or questions after discharge, please call your doctor. ACTIVITY You may resume your regular activity but move at a slower pace for the next 24 hours.  Take frequent rest periods for the next 24 hours.  Walking will help expel (get rid of) the air and reduce the bloated feeling in your abdomen.  No driving for 24 hours (because of the anesthesia (medicine) used during the test).  You may shower.  Do not sign any important legal documents or operate any machinery for 24 hours (because of the anesthesia used during the test).  NUTRITION Drink plenty of fluids.  You may resume your normal diet.  Begin with a light meal and progress to your normal diet.  Avoid alcoholic beverages for 24 hours or as instructed by your caregiver.  MEDICATIONS You may resume your normal medications unless your caregiver tells you otherwise.  WHAT YOU CAN EXPECT TODAY You may experience abdominal discomfort such as a feeling of fullness or gas pains.  FOLLOW-UP Your doctor will discuss the results of your test with you.  SEEK IMMEDIATE MEDICAL ATTENTION IF ANY OF THE FOLLOWING OCCUR: Excessive nausea (feeling sick to your stomach) and/or vomiting.  Severe abdominal pain and distention (swelling).  Trouble swallowing.  Temperature over 101 F (37.8 C).  Rectal bleeding or vomiting of blood.    Your EGD revealed mild amount inflammation in your stomach.  I took biopsies of this to rule out infection with a bacteria called H. pylori.  Await pathology results, my  office will contact you.  I also took biopsies of your duodenum to rule out celiac disease.  I will reach out to our office to see if we can get Dexilant cheaper for you.  Otherwise follow-up with Kristen in 4 months.   I hope you have a great rest of your week!  Elon Alas. Abbey Chatters, D.O. Gastroenterology and Hepatology [facility]

## 2021-08-25 NOTE — Interval H&P Note (Signed)
History and Physical Interval Note:  08/25/2021 8:36 AM  Whitney Reed  has presented today for surgery, with the diagnosis of GERD, upper abdominal pain.  The various methods of treatment have been discussed with the patient and family. After consideration of risks, benefits and other options for treatment, the patient has consented to  Procedure(s) with comments: ESOPHAGOGASTRODUODENOSCOPY (EGD) WITH PROPOFOL (N/A) - 8:00am as a surgical intervention.  The patient's history has been reviewed, patient examined, no change in status, stable for surgery.  I have reviewed the patient's chart and labs.  Questions were answered to the patient's satisfaction.     Eloise Harman

## 2021-08-25 NOTE — Anesthesia Postprocedure Evaluation (Signed)
Anesthesia Post Note  Patient: Cherylene Ferrufino Righter  Procedure(s) Performed: ESOPHAGOGASTRODUODENOSCOPY (EGD) WITH PROPOFOL BIOPSY  Patient location during evaluation: Phase II Anesthesia Type: General Level of consciousness: awake and alert and oriented Pain management: pain level controlled Vital Signs Assessment: post-procedure vital signs reviewed and stable Respiratory status: spontaneous breathing, nonlabored ventilation and respiratory function stable Cardiovascular status: blood pressure returned to baseline and stable Postop Assessment: no apparent nausea or vomiting Anesthetic complications: no   No notable events documented.   Last Vitals:  Vitals:   08/25/21 0746 08/25/21 0859  BP: (!) 148/80 116/66  Pulse: 98 86  Resp: 16 (!) 21  Temp: 36.7 C 36.8 C  SpO2: 96% 95%    Last Pain:  Vitals:   08/25/21 0859  TempSrc: Oral  PainSc: 0-No pain                 Atavia Poppe C Antonin Meininger

## 2021-08-25 NOTE — Transfer of Care (Signed)
Immediate Anesthesia Transfer of Care Note  Patient: Whitney Reed  Procedure(s) Performed: ESOPHAGOGASTRODUODENOSCOPY (EGD) WITH PROPOFOL BIOPSY  Patient Location: Short Stay  Anesthesia Type:MAC  Level of Consciousness: sedated and patient cooperative  Airway & Oxygen Therapy: Patient Spontanous Breathing and Patient connected to nasal cannula oxygen  Post-op Assessment: Report given to RN and Post -op Vital signs reviewed and stable  Post vital signs: Reviewed and stable  Last Vitals:  Vitals Value Taken Time  BP    Temp    Pulse    Resp    SpO2      Last Pain:  Vitals:   08/25/21 0844  TempSrc:   PainSc: 0-No pain         Complications: No notable events documented.

## 2021-08-26 LAB — SURGICAL PATHOLOGY

## 2021-08-29 DIAGNOSIS — M9903 Segmental and somatic dysfunction of lumbar region: Secondary | ICD-10-CM | POA: Diagnosis not present

## 2021-08-29 DIAGNOSIS — S233XXA Sprain of ligaments of thoracic spine, initial encounter: Secondary | ICD-10-CM | POA: Diagnosis not present

## 2021-08-29 DIAGNOSIS — M9902 Segmental and somatic dysfunction of thoracic region: Secondary | ICD-10-CM | POA: Diagnosis not present

## 2021-08-29 DIAGNOSIS — M9901 Segmental and somatic dysfunction of cervical region: Secondary | ICD-10-CM | POA: Diagnosis not present

## 2021-08-29 DIAGNOSIS — S134XXA Sprain of ligaments of cervical spine, initial encounter: Secondary | ICD-10-CM | POA: Diagnosis not present

## 2021-08-29 DIAGNOSIS — S338XXA Sprain of other parts of lumbar spine and pelvis, initial encounter: Secondary | ICD-10-CM | POA: Diagnosis not present

## 2021-08-30 ENCOUNTER — Telehealth: Payer: Self-pay | Admitting: Internal Medicine

## 2021-08-30 ENCOUNTER — Encounter (HOSPITAL_COMMUNITY): Payer: Self-pay | Admitting: Internal Medicine

## 2021-08-30 MED ORDER — NYSTATIN 100000 UNIT/ML MT SUSP: 5.0000 mL | Freq: Four times a day (QID) | OROMUCOSAL | 0 refills | Status: AC

## 2021-08-30 NOTE — Telephone Encounter (Signed)
Called pt and made her aware that RX was sent to pharmacy.

## 2021-08-30 NOTE — Telephone Encounter (Signed)
Lmom for pt to call me back. 

## 2021-08-30 NOTE — Telephone Encounter (Signed)
Patient called again  please call back

## 2021-08-30 NOTE — Telephone Encounter (Signed)
Nystatin swish and swallow sent to pharmacy.  Thank you

## 2021-08-31 ENCOUNTER — Telehealth: Payer: Self-pay | Admitting: Gastroenterology

## 2021-08-31 ENCOUNTER — Other Ambulatory Visit: Payer: Self-pay | Admitting: Gastroenterology

## 2021-08-31 DIAGNOSIS — K219 Gastro-esophageal reflux disease without esophagitis: Secondary | ICD-10-CM

## 2021-08-31 MED ORDER — OMEPRAZOLE 40 MG PO CPDR: 40.0000 mg | DELAYED_RELEASE_CAPSULE | Freq: Two times a day (BID) | ORAL | 3 refills | Status: DC

## 2021-08-31 NOTE — Telephone Encounter (Signed)
See other notes

## 2021-08-31 NOTE — Telephone Encounter (Signed)
Spoke to pt, she informed me that the Dexilant is to expensive. She states she is taking Protonix twice daily, but is still having some symptoms. I informed her that Omeprazole will be called in to her pharmacy [facility].  ?

## 2021-08-31 NOTE — Telephone Encounter (Signed)
Rx sent 

## 2021-08-31 NOTE — Telephone Encounter (Signed)
Whitney Reed, please reach out to patient and let her know I received a message from Dr. Abbey Chatters stating patient reported Dexilant was too expensive. If she has not been able to afford Dexilant and is still having symptoms with Protonix twice daily, I would like to send in omeprazole 40 mg twice daily to her pharmacy to take the place of Protonix. I do not think she has been on this in the past.  ? ? ?

## 2021-09-02 DIAGNOSIS — E063 Autoimmune thyroiditis: Secondary | ICD-10-CM | POA: Diagnosis not present

## 2021-09-02 DIAGNOSIS — E038 Other specified hypothyroidism: Secondary | ICD-10-CM | POA: Diagnosis not present

## 2021-09-03 LAB — T4, FREE: Free T4: 1.17 ng/dL (ref 0.82–1.77)

## 2021-09-03 LAB — TSH: TSH: 1.28 u[IU]/mL (ref 0.450–4.500)

## 2021-09-06 ENCOUNTER — Other Ambulatory Visit: Payer: Self-pay

## 2021-09-06 ENCOUNTER — Ambulatory Visit (INDEPENDENT_AMBULATORY_CARE_PROVIDER_SITE_OTHER): Payer: Medicare Other | Admitting: Nurse Practitioner

## 2021-09-06 ENCOUNTER — Encounter: Payer: Self-pay | Admitting: Nurse Practitioner

## 2021-09-06 VITALS — BP 154/86 | HR 78 | Ht 64.0 in | Wt 261.4 lb

## 2021-09-06 DIAGNOSIS — E063 Autoimmune thyroiditis: Secondary | ICD-10-CM

## 2021-09-06 DIAGNOSIS — E038 Other specified hypothyroidism: Secondary | ICD-10-CM | POA: Diagnosis not present

## 2021-09-06 NOTE — Patient Instructions (Signed)

## 2021-09-06 NOTE — Progress Notes (Signed)
Endocrinology Follow Up Note                                         09/06/2021, 3:45 PM  Subjective:   Subjective    Whitney Reed is a 71 y.o.-year-old female patient being seen in follow up after being seen in consultation for hypothyroidism referred by Rosalee Kaufman, PA-C.   Past Medical History:  Diagnosis Date   Anxiety    Arthritis    osteo   Chronic back pain    Constipation    GERD (gastroesophageal reflux disease)    Hypothyroidism    PONV (postoperative nausea and vomiting)     Past Surgical History:  Procedure Laterality Date   ABDOMINAL HYSTERECTOMY     BIOPSY  08/25/2021   Procedure: BIOPSY;  Surgeon: Eloise Harman, DO;  [facility];  Service: Endoscopy;;   CATARACT EXTRACTION W/PHACO Right 10/25/2020   Procedure: CATARACT EXTRACTION PHACO AND INTRAOCULAR LENS PLACEMENT RIGHT EYE;  Surgeon: Baruch Goldmann, MD;  Location: [facility];  Service: Ophthalmology;  Laterality: Right;  right CDE=4.59   CATARACT EXTRACTION W/PHACO Left 11/08/2020   Procedure: CATARACT EXTRACTION PHACO AND INTRAOCULAR LENS PLACEMENT LEFT EYE;  Surgeon: Baruch Goldmann, MD;  Location: [facility];  Service: Ophthalmology;  Laterality: Left;  left CDE=5.37   CHOLECYSTECTOMY     COLONOSCOPY     COLONOSCOPY WITH PROPOFOL N/A 12/02/2019   Surgeon: Daneil Dolin, MD; pancolonic diverticulosis, nonbleeding hemorrhoids.  No recommendations to repeat due to age.   ESOPHAGOGASTRODUODENOSCOPY     ESOPHAGOGASTRODUODENOSCOPY (EGD) WITH PROPOFOL N/A 08/25/2021   Procedure: ESOPHAGOGASTRODUODENOSCOPY (EGD) WITH PROPOFOL;  Surgeon: Eloise Harman, DO;  [facility];  Service: Endoscopy;  Laterality: N/A;  8:00am   PILONIDAL CYST EXCISION     TONSILLECTOMY      Social History   Socioeconomic History   Marital status: Married    Spouse name: Not on file   Number of children: Not on file   Years of  education: Not on file   Highest education level: Not on file  Occupational History   Not on file  Tobacco Use   Smoking status: Never   Smokeless tobacco: Never  Vaping Use   Vaping Use: Never used  Substance and Sexual Activity   Alcohol use: Never   Drug use: Never   Sexual activity: Yes  Other Topics Concern   Not on file  Social History Narrative   Not on file   Social Determinants of Health   Financial Resource Strain: Not on file  Food Insecurity: Not on file  Transportation Needs: Not on file  Physical Activity: Not on file  Stress: Not on file  Social Connections: Not on file    Family History  Problem Relation Age of Onset   Colon cancer Neg Hx     Outpatient Encounter Medications as of 09/06/2021  Medication Sig   acetaminophen (TYLENOL) 500 MG tablet Take 1,000 mg by mouth every 6 (six)  hours as needed for moderate pain.   albuterol (VENTOLIN HFA) 108 (90 Base) MCG/ACT inhaler Inhale 1-2 puffs into the lungs every 6 (six) hours as needed for wheezing or shortness of breath.   ALPRAZolam (XANAX) 1 MG tablet Take 1 mg by mouth 3 (three) times daily as needed for anxiety.    calcium carbonate (TUMS - DOSED IN MG ELEMENTAL CALCIUM) 500 MG chewable tablet Chew 1 tablet by mouth daily as needed for indigestion or heartburn.   Diphenhyd-Hydrocort-Nystatin (FIRST-DUKES MOUTHWASH MT) TAKE ONE (5ML) TO TWO (10ML) TEASPOONSFUL BY MOUTH UP TO TWICE DAILY SWISH AND SPIT   DULoxetine (CYMBALTA) 30 MG capsule Take 30 mg by mouth 3 (three) times daily.   levothyroxine (SYNTHROID) 150 MCG tablet Take 1 tablet (150 mcg total) by mouth daily before breakfast.   Mag Oxide-Vit D3-Turmeric (MAGNESIUM-VITAMIN D3-TURMERIC PO) Take 1 capsule by mouth 2 (two) times a week.   Multiple Vitamins-Minerals (PRESERVISION AREDS 2 PO) Take 1 capsule by mouth in the morning and at bedtime.   nystatin (MYCOSTATIN) 100000 UNIT/ML suspension Take 5 mLs (500,000 Units total) by mouth 4 (four) times  daily for 14 days.   omeprazole (PRILOSEC) 40 MG capsule Take 1 capsule (40 mg total) by mouth 2 (two) times daily before a meal.   Polyethyl Glycol-Propyl Glycol (LUBRICANT EYE DROPS) 0.4-0.3 % SOLN Place 1 drop into both eyes 3 (three) times daily as needed (dry/irritated eyes.).   No facility-administered encounter medications on file as of 09/06/2021.    ALLERGIES: Allergies  Allergen Reactions   Erythromycin Rash   VACCINATION STATUS:  There is no immunization history on file for this patient.   HPI  Thyroid Problem Presents for follow-up visit. Symptoms include hair loss. Patient reports no fatigue, hoarse voice, palpitations, tremors or weight loss. The symptoms have been stable. Past treatments include levothyroxine. The treatment provided mild relief. Her past medical history is significant for obesity. There are no known risk factors.   Whitney Reed  is a patient with the above medical history. she was diagnosed with Hashimoto's hypothyroidism at approximate age of 63 years, which required subsequent initiation of thyroid hormone replacement. she was given various doses of Levothyroxine over the years, currently on 150 micrograms. she reports compliance to this medication.    I reviewed patient's thyroid tests:  Lab Results  Component Value Date   TSH 1.280 09/02/2021   TSH 12.200 (H) 05/27/2021   TSH 7.750 (H) 03/16/2021   TSH 9.220 (H) 01/21/2021   TSH 65.60 (A) 11/22/2020   TSH 2.78 09/07/2020   FREET4 1.17 09/02/2021   FREET4 0.80 (L) 05/27/2021   FREET4 1.37 03/16/2021   FREET4 0.95 01/21/2021     Pt denies feeling nodules in neck,  she does have some mild hoarseness-attributes this to allergies, has no dysphagia/odynophagia, SOB with lying down.  she denies family history of thyroid disorders that she is aware of.  No family history of thyroid cancer.  No history of radiation therapy to head or neck.  No recent use of iodine supplements.  Denies use of  Biotin containing supplements.  I reviewed her chart and she also has a history of GERD.   Review of systems  Constitutional: + Minimally fluctuating body weight,  current Body mass index is 44.87 kg/m. , no fatigue, no subjective hyperthermia, no subjective hypothermia Eyes: no blurry vision, no xerophthalmia ENT: no sore throat, no nodules palpated in throat, no dysphagia/odynophagia, no hoarseness Cardiovascular: no chest pain, no shortness of  breath, no palpitations, no leg swelling Respiratory: no cough, no shortness of breath Gastrointestinal: no nausea/vomiting/diarrhea Musculoskeletal: no muscle/joint aches Skin: no rashes, no hyperemia, + hair loss Neurological: no tremors, no numbness, no tingling, no dizziness Psychiatric: no depression, no anxiety   Objective:   Objective     BP (!) 154/86    Pulse 78    Ht '5\' 4"'$  (1.626 m)    Wt 261 lb 6.4 oz (118.6 kg)    SpO2 97%    BMI 44.87 kg/m  Wt Readings from Last 3 Encounters:  09/06/21 261 lb 6.4 oz (118.6 kg)  08/25/21 253 lb (114.8 kg)  08/01/21 256 lb 12.8 oz (116.5 kg)    BP Readings from Last 3 Encounters:  09/06/21 (!) 154/86  08/25/21 116/66  08/01/21 (!) 174/91      Physical Exam- Limited  Constitutional:  Body mass index is 44.87 kg/m. , not in acute distress, normal state of mind Eyes:  EOMI, no exophthalmos Neck: Supple Cardiovascular: RRR, no murmurs, rubs, or gallops, no edema Respiratory: Adequate breathing efforts, no crackles, rales, rhonchi, or wheezing Musculoskeletal: no gross deformities, strength intact in all four extremities, no gross restriction of joint movements Skin:  no rashes, no hyperemia Neurological: no tremor with outstretched hands   CMP ( most recent) CMP  No results found for: NA, K, CL, CO2, GLUCOSE, BUN, CREATININE, CALCIUM, PROT, ALBUMIN, AST, ALT, ALKPHOS, BILITOT, GFRNONAA, GFRAA   Diabetic Labs (most recent): No results found for: HGBA1C   Lipid Panel ( most  recent) Lipid Panel  No results found for: CHOL, TRIG, HDL, CHOLHDL, VLDL, LDLCALC, LDLDIRECT, LABVLDL     Lab Results  Component Value Date   TSH 1.280 09/02/2021   TSH 12.200 (H) 05/27/2021   TSH 7.750 (H) 03/16/2021   TSH 9.220 (H) 01/21/2021   TSH 65.60 (A) 11/22/2020   TSH 2.78 09/07/2020   FREET4 1.17 09/02/2021   FREET4 0.80 (L) 05/27/2021   FREET4 1.37 03/16/2021   FREET4 0.95 01/21/2021       Latest Reference Range & Units 11/22/20 00:00 01/21/21 15:53 03/16/21 15:01 05/27/21 14:33 09/02/21 15:45  TSH 0.450 - 4.500 uIU/mL 65.60 ! (E) 9.220 (H) 7.750 (H) 12.200 (H) 1.280  T4,Free(Direct) 0.82 - 1.77 ng/dL  0.95 1.37 0.80 (L) 1.17  !: Data is abnormal (H): Data is abnormally high (L): Data is abnormally low (E): External lab result  Assessment & Plan:   ASSESSMENT / PLAN:  1. Hypothyroidism- r/t Hashimoto's thyroiditis  -Her previsit thyroid function tests are consistent with appropriate hormone replacement.  She is advised to continue Levothyroxine 150 mcg po daily before breakfast.   Patient with long-standing hypothyroidism, on Levothyroxine therapy. On physical exam patient does not have gross goiter, thyroid nodules, or neck compression symptoms.  - We discussed about correct intake of levothyroxine, at fasting, with water, separated by at least 30 minutes from breakfast, and separated by more than 4 hours from calcium, iron, multivitamins, acid reflux medications (PPIs). -Patient is made aware of the fact that thyroid hormone replacement is needed for life, dose to be adjusted by periodic monitoring of thyroid function tests.     I spent 20 minutes in the care of the patient today including review of labs from Thyroid Function, CMP, and other relevant labs ; imaging/biopsy records (current and previous including abstractions from other facilities); face-to-face time discussing  her lab results and symptoms, medications doses, her options of short and long  term treatment based on the  latest standards of care / guidelines;   and documenting the encounter.  Zhaniya Swallows Criado  participated in the discussions, expressed understanding, and voiced agreement with the above plans.  All questions were answered to her satisfaction. she is encouraged to contact clinic should she have any questions or concerns prior to her return visit.   FOLLOW UP PLAN:  Return in about 4 months (around 01/06/2022) for Thyroid follow up, Previsit labs.    Rayetta Pigg, [facility] [address] [place], [place] [address] Phone: [phone] Fax: [phone]  09/06/2021, 3:45 PM

## 2021-09-12 DIAGNOSIS — S233XXA Sprain of ligaments of thoracic spine, initial encounter: Secondary | ICD-10-CM | POA: Diagnosis not present

## 2021-09-12 DIAGNOSIS — M9901 Segmental and somatic dysfunction of cervical region: Secondary | ICD-10-CM | POA: Diagnosis not present

## 2021-09-12 DIAGNOSIS — S134XXA Sprain of ligaments of cervical spine, initial encounter: Secondary | ICD-10-CM | POA: Diagnosis not present

## 2021-09-12 DIAGNOSIS — M9903 Segmental and somatic dysfunction of lumbar region: Secondary | ICD-10-CM | POA: Diagnosis not present

## 2021-09-12 DIAGNOSIS — S338XXA Sprain of other parts of lumbar spine and pelvis, initial encounter: Secondary | ICD-10-CM | POA: Diagnosis not present

## 2021-09-12 DIAGNOSIS — M9902 Segmental and somatic dysfunction of thoracic region: Secondary | ICD-10-CM | POA: Diagnosis not present

## 2021-09-19 DIAGNOSIS — S338XXA Sprain of other parts of lumbar spine and pelvis, initial encounter: Secondary | ICD-10-CM | POA: Diagnosis not present

## 2021-09-19 DIAGNOSIS — M9903 Segmental and somatic dysfunction of lumbar region: Secondary | ICD-10-CM | POA: Diagnosis not present

## 2021-09-19 DIAGNOSIS — S233XXA Sprain of ligaments of thoracic spine, initial encounter: Secondary | ICD-10-CM | POA: Diagnosis not present

## 2021-09-19 DIAGNOSIS — S134XXA Sprain of ligaments of cervical spine, initial encounter: Secondary | ICD-10-CM | POA: Diagnosis not present

## 2021-09-19 DIAGNOSIS — M9902 Segmental and somatic dysfunction of thoracic region: Secondary | ICD-10-CM | POA: Diagnosis not present

## 2021-09-19 DIAGNOSIS — M9901 Segmental and somatic dysfunction of cervical region: Secondary | ICD-10-CM | POA: Diagnosis not present

## 2021-09-26 DIAGNOSIS — E039 Hypothyroidism, unspecified: Secondary | ICD-10-CM | POA: Diagnosis not present

## 2021-09-26 DIAGNOSIS — Z23 Encounter for immunization: Secondary | ICD-10-CM | POA: Diagnosis not present

## 2021-09-26 DIAGNOSIS — K219 Gastro-esophageal reflux disease without esophagitis: Secondary | ICD-10-CM | POA: Diagnosis not present

## 2021-09-26 DIAGNOSIS — M79672 Pain in left foot: Secondary | ICD-10-CM | POA: Diagnosis not present

## 2021-09-26 DIAGNOSIS — Z1389 Encounter for screening for other disorder: Secondary | ICD-10-CM | POA: Diagnosis not present

## 2021-09-26 DIAGNOSIS — Z0001 Encounter for general adult medical examination with abnormal findings: Secondary | ICD-10-CM | POA: Diagnosis not present

## 2021-09-26 DIAGNOSIS — B370 Candidal stomatitis: Secondary | ICD-10-CM | POA: Diagnosis not present

## 2021-11-07 DIAGNOSIS — M79672 Pain in left foot: Secondary | ICD-10-CM | POA: Diagnosis not present

## 2021-11-07 DIAGNOSIS — M19072 Primary osteoarthritis, left ankle and foot: Secondary | ICD-10-CM | POA: Diagnosis not present

## 2021-11-07 DIAGNOSIS — M2142 Flat foot [pes planus] (acquired), left foot: Secondary | ICD-10-CM | POA: Diagnosis not present

## 2021-11-15 DIAGNOSIS — G43909 Migraine, unspecified, not intractable, without status migrainosus: Secondary | ICD-10-CM | POA: Diagnosis not present

## 2021-11-15 DIAGNOSIS — K219 Gastro-esophageal reflux disease without esophagitis: Secondary | ICD-10-CM | POA: Diagnosis not present

## 2021-11-15 DIAGNOSIS — I10 Essential (primary) hypertension: Secondary | ICD-10-CM | POA: Diagnosis not present

## 2021-11-15 DIAGNOSIS — E039 Hypothyroidism, unspecified: Secondary | ICD-10-CM | POA: Diagnosis not present

## 2021-11-15 DIAGNOSIS — B370 Candidal stomatitis: Secondary | ICD-10-CM | POA: Diagnosis not present

## 2021-11-15 DIAGNOSIS — L821 Other seborrheic keratosis: Secondary | ICD-10-CM | POA: Diagnosis not present

## 2021-12-05 NOTE — Progress Notes (Unsigned)
Referring Provider: Rosalee Kaufman, * Primary Care Physician:  Rosalee Kaufman, PA-C Primary GI Physician: Dr. Abbey Chatters  No chief complaint on file.   HPI:   Whitney Reed is a 71 y.o. female with GI history of GERD, dyspepsia, diverticulitis in 2021 with follow-up colonoscopy in June 2021 with pancolonic diverticulosis, nonbleeding internal hemorrhoids, no recommendations to repeat, presenting today for follow-up of dyspepsia, GERD, constipation.  Last seen in our office 08/01/2021.  GERD was uncontrolled on Protonix 40 mg twice daily which she had been on for quite some time.  Suspected body habitus and chronic NSAID use likely contributing. Continued with intermittent upper abdominal pain, primarily epigastric.  Suspected this was multifactorial in the setting of uncontrolled GERD, chronic NSAID use, constipation, and possible lactose/gluten intolerance.  Reported she was having some trouble with constipation with bowel movements every 4 to 5 days, often [place] 1.  No alarm symptoms.  Occasional loose stools if consuming dairy.  She also had oral candidiasis which she had already been treated for x2.  Plan included EGD, start Protonix and start Dexilant, discontinue NSAIDs, start Linzess 72 mcg daily, Diflucan 200 mg daily x7 days, and follow-up after EGD.  EGD 08/25/2021: Medium size hiatal hernia, mild Schatzki's ring, gastritis biopsied, normal examined duodenum biopsied.  Duodenal biopsy benign, gastric biopsy benign.  Received message from Dr. Abbey Chatters in March stating Dexilant was too expensive.  Recommended omeprazole 40 mg twice daily.   Today:   GERD:  Dyspepsia:  Gluten/dairy avoidance?  Constipation:  Past Medical History:  Diagnosis Date   Anxiety    Arthritis    osteo   Chronic back pain    Constipation    GERD (gastroesophageal reflux disease)    Hypothyroidism    PONV (postoperative nausea and vomiting)     Past Surgical History:  Procedure  Laterality Date   ABDOMINAL HYSTERECTOMY     BIOPSY  08/25/2021   Procedure: BIOPSY;  Surgeon: Eloise Harman, DO;  [facility];  Service: Endoscopy;;   CATARACT EXTRACTION W/PHACO Right 10/25/2020   Procedure: CATARACT EXTRACTION PHACO AND INTRAOCULAR LENS PLACEMENT RIGHT EYE;  Surgeon: Baruch Goldmann, MD;  Location: [facility];  Service: Ophthalmology;  Laterality: Right;  right CDE=4.59   CATARACT EXTRACTION W/PHACO Left 11/08/2020   Procedure: CATARACT EXTRACTION PHACO AND INTRAOCULAR LENS PLACEMENT LEFT EYE;  Surgeon: Baruch Goldmann, MD;  Location: [facility];  Service: Ophthalmology;  Laterality: Left;  left CDE=5.37   CHOLECYSTECTOMY     COLONOSCOPY     COLONOSCOPY WITH PROPOFOL N/A 12/02/2019   Surgeon: Daneil Dolin, MD; pancolonic diverticulosis, nonbleeding hemorrhoids.  No recommendations to repeat due to age.   ESOPHAGOGASTRODUODENOSCOPY     ESOPHAGOGASTRODUODENOSCOPY (EGD) WITH PROPOFOL N/A 08/25/2021   Procedure: ESOPHAGOGASTRODUODENOSCOPY (EGD) WITH PROPOFOL;  Surgeon: Eloise Harman, DO;  [facility];  Service: Endoscopy;  Laterality: N/A;  8:00am   PILONIDAL CYST EXCISION     TONSILLECTOMY      Current Outpatient Medications  Medication Sig Dispense Refill   acetaminophen (TYLENOL) 500 MG tablet Take 1,000 mg by mouth every 6 (six) hours as needed for moderate pain.     albuterol (VENTOLIN HFA) 108 (90 Base) MCG/ACT inhaler Inhale 1-2 puffs into the lungs every 6 (six) hours as needed for wheezing or shortness of breath.     ALPRAZolam (XANAX) 1 MG tablet Take 1 mg by mouth 3 (three) times daily as needed for anxiety.  calcium carbonate (TUMS - DOSED IN MG ELEMENTAL CALCIUM) 500 MG chewable tablet Chew 1 tablet by mouth daily as needed for indigestion or heartburn.     Diphenhyd-Hydrocort-Nystatin (FIRST-DUKES MOUTHWASH MT) TAKE ONE (5ML) TO TWO (10ML) TEASPOONSFUL BY MOUTH UP TO TWICE DAILY SWISH AND SPIT     DULoxetine (CYMBALTA) 30 MG capsule  Take 30 mg by mouth 3 (three) times daily.     levothyroxine (SYNTHROID) 150 MCG tablet Take 1 tablet (150 mcg total) by mouth daily before breakfast. 90 tablet 1   Mag Oxide-Vit D3-Turmeric (MAGNESIUM-VITAMIN D3-TURMERIC PO) Take 1 capsule by mouth 2 (two) times a week.     Multiple Vitamins-Minerals (PRESERVISION AREDS 2 PO) Take 1 capsule by mouth in the morning and at bedtime.     omeprazole (PRILOSEC) 40 MG capsule Take 1 capsule (40 mg total) by mouth 2 (two) times daily before a meal. 60 capsule 3   Polyethyl Glycol-Propyl Glycol (LUBRICANT EYE DROPS) 0.4-0.3 % SOLN Place 1 drop into both eyes 3 (three) times daily as needed (dry/irritated eyes.).     No current facility-administered medications for this visit.    Allergies as of 12/07/2021 - Review Complete 09/06/2021  Allergen Reaction Noted   Erythromycin Rash     Family History  Problem Relation Age of Onset   Colon cancer Neg Hx     Social History   Socioeconomic History   Marital status: Married    Spouse name: Not on file   Number of children: Not on file   Years of education: Not on file   Highest education level: Not on file  Occupational History   Not on file  Tobacco Use   Smoking status: Never   Smokeless tobacco: Never  Vaping Use   Vaping Use: Never used  Substance and Sexual Activity   Alcohol use: Never   Drug use: Never   Sexual activity: Yes  Other Topics Concern   Not on file  Social History Narrative   Not on file   Social Determinants of Health   Financial Resource Strain: Not on file  Food Insecurity: Not on file  Transportation Needs: Not on file  Physical Activity: Not on file  Stress: Not on file  Social Connections: Not on file    Review of Systems: Gen: Denies fever, chills, cold or flulike symptoms, presyncope, syncope. CV: Denies chest pain, palpitations.  Resp: Denies dyspnea, cough.  GI: See HPI Heme: See HPI  Physical Exam: There were no vitals taken for this  visit. General:   Alert and oriented. No distress noted. Pleasant and cooperative.  Head:  Normocephalic and atraumatic. Eyes:  Conjuctiva clear without scleral icterus. Heart:  S1, S2 present without murmurs appreciated. Lungs:  Clear to auscultation bilaterally. No wheezes, rales, or rhonchi. No distress.  Abdomen:  +BS, soft, non-tender and non-distended. No rebound or guarding. No HSM or masses noted. Msk:  Symmetrical without gross deformities. Normal posture. Extremities:  Without edema. Neurologic:  Alert and  oriented x4 Psych:  Normal mood and affect.    Assessment:     Plan:  ***   Aliene Altes, PA-C [facility] 12/07/2021

## 2021-12-06 ENCOUNTER — Telehealth: Payer: Self-pay | Admitting: Nurse Practitioner

## 2021-12-06 NOTE — Telephone Encounter (Signed)
Patient left a Vm stating she has been having heart palpitations and thinks she needs her thyroid checked before July. Please Advise

## 2021-12-07 ENCOUNTER — Ambulatory Visit: Payer: Medicare Other | Admitting: Gastroenterology

## 2021-12-07 ENCOUNTER — Other Ambulatory Visit: Payer: Self-pay | Admitting: Nurse Practitioner

## 2021-12-07 ENCOUNTER — Encounter: Payer: Self-pay | Admitting: Gastroenterology

## 2021-12-07 VITALS — BP 130/82 | HR 90 | Temp 97.6°F | Ht 64.0 in | Wt 246.4 lb

## 2021-12-07 DIAGNOSIS — K219 Gastro-esophageal reflux disease without esophagitis: Secondary | ICD-10-CM

## 2021-12-07 DIAGNOSIS — K5900 Constipation, unspecified: Secondary | ICD-10-CM | POA: Diagnosis not present

## 2021-12-07 DIAGNOSIS — E038 Other specified hypothyroidism: Secondary | ICD-10-CM

## 2021-12-07 DIAGNOSIS — R1013 Epigastric pain: Secondary | ICD-10-CM | POA: Diagnosis not present

## 2021-12-07 NOTE — Telephone Encounter (Signed)
Left patient message

## 2021-12-07 NOTE — Telephone Encounter (Signed)
Lets go ahead and repeat her labs now.  I have already ordered them.

## 2021-12-07 NOTE — Patient Instructions (Signed)
Continue omeprazole 40 mg twice daily 30 minutes before breakfast and dinner.  Follow a GERD diet:  Avoid fried, fatty, greasy, spicy, citrus foods. Avoid caffeine and carbonated beverages. Avoid chocolate. Try eating 4-6 small meals a day rather than 3 large meals. Do not eat within 3 hours of laying down. Prop head of bed up on wood or bricks to create a 6 inch incline.  Try to limit all NSAID products as much as possible including ibuprofen, Aleve, Advil, naproxen, diclofenac, BC powders, Goody powders, and anything that says "NSAID" on the package.   Continue to avoid dairy products or take Lactaid tablets prior to dairy consumption.  We will plan to see you back in 6 months or sooner if needed.  It was great to see you again today!  Enjoy your summer!  Aliene Altes, PA-C [facility]

## 2021-12-19 DIAGNOSIS — H43812 Vitreous degeneration, left eye: Secondary | ICD-10-CM | POA: Diagnosis not present

## 2021-12-19 DIAGNOSIS — H01001 Unspecified blepharitis right upper eyelid: Secondary | ICD-10-CM | POA: Diagnosis not present

## 2021-12-19 DIAGNOSIS — H01002 Unspecified blepharitis right lower eyelid: Secondary | ICD-10-CM | POA: Diagnosis not present

## 2021-12-19 DIAGNOSIS — H35371 Puckering of macula, right eye: Secondary | ICD-10-CM | POA: Diagnosis not present

## 2022-01-09 ENCOUNTER — Ambulatory Visit: Payer: Medicare Other | Admitting: Nurse Practitioner

## 2022-01-09 ENCOUNTER — Other Ambulatory Visit: Payer: Self-pay | Admitting: Gastroenterology

## 2022-01-09 DIAGNOSIS — K219 Gastro-esophageal reflux disease without esophagitis: Secondary | ICD-10-CM

## 2022-01-18 DIAGNOSIS — E038 Other specified hypothyroidism: Secondary | ICD-10-CM | POA: Diagnosis not present

## 2022-01-18 DIAGNOSIS — E063 Autoimmune thyroiditis: Secondary | ICD-10-CM | POA: Diagnosis not present

## 2022-01-19 LAB — TSH: TSH: 12.1 u[IU]/mL — ABNORMAL HIGH (ref 0.450–4.500)

## 2022-01-19 LAB — T4, FREE: Free T4: 1.01 ng/dL (ref 0.82–1.77)

## 2022-01-24 ENCOUNTER — Ambulatory Visit: Payer: Medicare Other | Admitting: Nurse Practitioner

## 2022-02-10 ENCOUNTER — Telehealth: Payer: Self-pay | Admitting: Nurse Practitioner

## 2022-02-10 ENCOUNTER — Ambulatory Visit: Payer: Medicare PPO | Admitting: Nurse Practitioner

## 2022-02-10 MED ORDER — LEVOTHYROXINE SODIUM 150 MCG PO TABS: 137.0000 ug | ORAL_TABLET | Freq: Every day | ORAL | 1 refills | Status: AC

## 2022-02-10 NOTE — Telephone Encounter (Signed)
Patient had to r/s her appt today due to a migraine. She can not drive. She is asking for a refill on her thyroid medication to [facility]. Thank you

## 2022-02-10 NOTE — Telephone Encounter (Signed)
Ok, I will send in order for her

## 2022-02-13 DIAGNOSIS — R030 Elevated blood-pressure reading, without diagnosis of hypertension: Secondary | ICD-10-CM | POA: Diagnosis not present

## 2022-02-13 DIAGNOSIS — B370 Candidal stomatitis: Secondary | ICD-10-CM | POA: Diagnosis not present

## 2022-02-13 DIAGNOSIS — Z6841 Body Mass Index (BMI) 40.0 and over, adult: Secondary | ICD-10-CM | POA: Diagnosis not present

## 2022-02-22 DIAGNOSIS — R030 Elevated blood-pressure reading, without diagnosis of hypertension: Secondary | ICD-10-CM | POA: Diagnosis not present

## 2022-02-22 DIAGNOSIS — Z6841 Body Mass Index (BMI) 40.0 and over, adult: Secondary | ICD-10-CM | POA: Diagnosis not present

## 2022-02-22 DIAGNOSIS — J209 Acute bronchitis, unspecified: Secondary | ICD-10-CM | POA: Diagnosis not present

## 2022-02-27 ENCOUNTER — Ambulatory Visit (INDEPENDENT_AMBULATORY_CARE_PROVIDER_SITE_OTHER): Payer: Medicare PPO | Admitting: Nurse Practitioner

## 2022-02-27 ENCOUNTER — Encounter: Payer: Self-pay | Admitting: Nurse Practitioner

## 2022-02-27 VITALS — BP 137/83 | HR 99 | Ht 64.0 in | Wt 252.6 lb

## 2022-02-27 DIAGNOSIS — E038 Other specified hypothyroidism: Secondary | ICD-10-CM

## 2022-02-27 DIAGNOSIS — E063 Autoimmune thyroiditis: Secondary | ICD-10-CM

## 2022-02-27 NOTE — Progress Notes (Signed)
Endocrinology Follow Up Note                                         02/27/2022, 2:29 PM  Subjective:   Subjective    Whitney Reed is a 71 y.o.-year-old female patient being seen in follow up after being seen in consultation for hypothyroidism referred by Rosalee Kaufman, PA-C.   Past Medical History:  Diagnosis Date   Anxiety    Arthritis    osteo   Chronic back pain    Constipation    Diabetes ([place])    GERD (gastroesophageal reflux disease)    Hypothyroidism    PONV (postoperative nausea and vomiting)     Past Surgical History:  Procedure Laterality Date   ABDOMINAL HYSTERECTOMY     BIOPSY  08/25/2021   Procedure: BIOPSY;  Surgeon: Eloise Harman, DO;  Location: [facility];  Service: Endoscopy;;   CATARACT EXTRACTION W/PHACO Right 10/25/2020   Procedure: CATARACT EXTRACTION PHACO AND INTRAOCULAR LENS PLACEMENT RIGHT EYE;  Surgeon: Baruch Goldmann, MD;  Location: [facility];  Service: Ophthalmology;  Laterality: Right;  right CDE=4.59   CATARACT EXTRACTION W/PHACO Left 11/08/2020   Procedure: CATARACT EXTRACTION PHACO AND INTRAOCULAR LENS PLACEMENT LEFT EYE;  Surgeon: Baruch Goldmann, MD;  Location: [facility];  Service: Ophthalmology;  Laterality: Left;  left CDE=5.37   CHOLECYSTECTOMY     COLONOSCOPY     COLONOSCOPY WITH PROPOFOL N/A 12/02/2019   Surgeon: Daneil Dolin, MD; pancolonic diverticulosis, nonbleeding hemorrhoids.  No recommendations to repeat due to age.   ESOPHAGOGASTRODUODENOSCOPY     ESOPHAGOGASTRODUODENOSCOPY (EGD) WITH PROPOFOL N/A 08/25/2021   Surgeon: Eloise Harman, DO;  Medium size hiatal hernia, mild Schatzki's ring, gastritis biopsied, normal examined duodenum biopsied.  Duodenal biopsy benign, gastric biopsy benign.   PILONIDAL CYST EXCISION     TONSILLECTOMY      Social History   Socioeconomic History   Marital status: Married    Spouse name: Not on file    Number of children: Not on file   Years of education: Not on file   Highest education level: Not on file  Occupational History   Not on file  Tobacco Use   Smoking status: Never   Smokeless tobacco: Never  Vaping Use   Vaping Use: Never used  Substance and Sexual Activity   Alcohol use: Never   Drug use: Never   Sexual activity: Yes  Other Topics Concern   Not on file  Social History Narrative   Not on file   Social Determinants of Health   Financial Resource Strain: Not on file  Food Insecurity: Not on file  Transportation Needs: Not on file  Physical Activity: Not on file  Stress: Not on file  Social Connections: Not on file    Family History  Problem Relation Age of Onset   Colon cancer Neg Hx     Outpatient Encounter Medications as of 02/27/2022  Medication Sig   acetaminophen (TYLENOL) 500 MG tablet Take  1,000 mg by mouth every 6 (six) hours as needed for moderate pain.   albuterol (VENTOLIN HFA) 108 (90 Base) MCG/ACT inhaler Inhale 1-2 puffs into the lungs every 6 (six) hours as needed for wheezing or shortness of breath.   ALPRAZolam (XANAX) 1 MG tablet Take 1 mg by mouth 3 (three) times daily as needed for anxiety.    calcium carbonate (TUMS - DOSED IN MG ELEMENTAL CALCIUM) 500 MG chewable tablet Chew 1 tablet by mouth daily as needed for indigestion or heartburn.   DULoxetine (CYMBALTA) 30 MG capsule Take 30 mg by mouth 3 (three) times daily.   levothyroxine (SYNTHROID) 150 MCG tablet Take 1 tablet (150 mcg total) by mouth daily before breakfast.   metFORMIN (GLUCOPHAGE) 500 MG tablet Take by mouth 2 (two) times daily with a meal.   Multiple Vitamins-Minerals (PRESERVISION AREDS 2 PO) Take 1 capsule by mouth in the morning and at bedtime.   omeprazole (PRILOSEC) 40 MG capsule TAKE ONE CAPSULE BY MOUTH TWICE DAILY BEFORE MEALS   Polyethyl Glycol-Propyl Glycol (LUBRICANT EYE DROPS) 0.4-0.3 % SOLN Place 1 drop into both eyes 3 (three) times daily as needed  (dry/irritated eyes.).   topiramate (TOPAMAX) 25 MG tablet Take 100 mg by mouth daily.   fluconazole (DIFLUCAN) 150 MG tablet Take 150 mg by mouth once.   No facility-administered encounter medications on file as of 02/27/2022.    ALLERGIES: Allergies  Allergen Reactions   Erythromycin Rash   VACCINATION STATUS:  There is no immunization history on file for this patient.   HPI  Thyroid Problem Presents for follow-up visit. Symptoms include diarrhea, hair loss, heat intolerance, palpitations, tremors and weight loss. Patient reports no fatigue or hoarse voice. (Says she was diagnosed with bronchitis recently.  On Abx) The symptoms have been stable. Past treatments include levothyroxine. The treatment provided mild relief. Her past medical history is significant for obesity. There are no known risk factors.    Whitney Reed  is a patient with the above medical history. she was diagnosed with Hashimoto's hypothyroidism at approximate age of 75 years, which required subsequent initiation of thyroid hormone replacement. she was given various doses of Levothyroxine over the years, currently on 150 micrograms. she reports compliance to this medication.    I reviewed patient's thyroid tests:  Lab Results  Component Value Date   TSH 12.100 (H) 01/18/2022   TSH 1.280 09/02/2021   TSH 12.200 (H) 05/27/2021   TSH 7.750 (H) 03/16/2021   TSH 9.220 (H) 01/21/2021   TSH 65.60 (A) 11/22/2020   TSH 2.78 09/07/2020   FREET4 1.01 01/18/2022   FREET4 1.17 09/02/2021   FREET4 0.80 (L) 05/27/2021   FREET4 1.37 03/16/2021   FREET4 0.95 01/21/2021     Pt denies feeling nodules in neck,  she does have some mild hoarseness-attributes this to allergies, has no dysphagia/odynophagia, SOB with lying down.  she denies family history of thyroid disorders that she is aware of.  No family history of thyroid cancer.  No history of radiation therapy to head or neck.  No recent use of iodine supplements.   Denies use of Biotin containing supplements.  I reviewed her chart and she also has a history of GERD.   Review of systems  Constitutional: + steadily decreasing body weight,  current Body mass index is 43.36 kg/m. , no fatigue, + subjective hyperthermia, no subjective hypothermia Eyes: no blurry vision, no xerophthalmia ENT: no sore throat, no nodules palpated in throat, no dysphagia/odynophagia, no  hoarseness Cardiovascular: no chest pain, no shortness of breath, + palpitations, no leg swelling Respiratory: + cough- being treated for bronchitis, no shortness of breath Gastrointestinal: no nausea/vomiting, + diarrhea Musculoskeletal: no muscle/joint aches Skin: no rashes, no hyperemia, + hair loss Neurological: + tremors, no numbness, no tingling, no dizziness Psychiatric: no depression, + anxiety   Objective:   Objective     BP 137/83 (BP Location: Left Arm, Patient Position: Sitting, Cuff Size: Large)   Pulse 99   Ht '5\' 4"'$  (1.626 m)   Wt 252 lb 9.6 oz (114.6 kg)   BMI 43.36 kg/m  Wt Readings from Last 3 Encounters:  02/27/22 252 lb 9.6 oz (114.6 kg)  12/07/21 246 lb 6.4 oz (111.8 kg)  09/06/21 261 lb 6.4 oz (118.6 kg)    BP Readings from Last 3 Encounters:  02/27/22 137/83  12/07/21 130/82  09/06/21 (!) 154/86      Physical Exam- Limited  Constitutional:  Body mass index is 43.36 kg/m. , not in acute distress, normal state of mind Eyes:  EOMI, no exophthalmos Neck: Supple Musculoskeletal: no gross deformities, strength intact in all four extremities, no gross restriction of joint movements Skin:  no rashes, no hyperemia Neurological: no tremor with outstretched hands   CMP ( most recent) CMP  No results found for: "NA", "K", "CL", "CO2", "GLUCOSE", "BUN", "CREATININE", "CALCIUM", "PROT", "ALBUMIN", "AST", "ALT", "ALKPHOS", "BILITOT", "GFRNONAA", "GFRAA"   Diabetic Labs (most recent): No results found for: "HGBA1C", "MICROALBUR"   Lipid Panel ( most  recent) Lipid Panel  No results found for: "CHOL", "TRIG", "HDL", "CHOLHDL", "VLDL", "LDLCALC", "LDLDIRECT", "LABVLDL"     Lab Results  Component Value Date   TSH 12.100 (H) 01/18/2022   TSH 1.280 09/02/2021   TSH 12.200 (H) 05/27/2021   TSH 7.750 (H) 03/16/2021   TSH 9.220 (H) 01/21/2021   TSH 65.60 (A) 11/22/2020   TSH 2.78 09/07/2020   FREET4 1.01 01/18/2022   FREET4 1.17 09/02/2021   FREET4 0.80 (L) 05/27/2021   FREET4 1.37 03/16/2021   FREET4 0.95 01/21/2021       Latest Reference Range & Units 01/21/21 15:53 03/16/21 15:01 05/27/21 14:33 09/02/21 15:45 01/18/22 14:59  TSH 0.450 - 4.500 uIU/mL 9.220 (H) 7.750 (H) 12.200 (H) 1.280 12.100 (H)  T4,Free(Direct) 0.82 - 1.77 ng/dL 0.95 1.37 0.80 (L) 1.17 1.01  (H): Data is abnormally high (L): Data is abnormally low  Assessment & Plan:   ASSESSMENT / PLAN:  1. Hypothyroidism- r/t Hashimoto's thyroiditis  -Her previsit thyroid function tests are consistent with appropriate hormone replacement.  She is advised to continue Levothyroxine 150 mcg po daily before breakfast.  Given her symptoms, will recheck labs today to make sure she isn't over-replaced, and I will call her with the results and further instructions.  Will check labs again prior to her next appt in 4 months.   Patient with long-standing hypothyroidism, on Levothyroxine therapy. On physical exam patient does not have gross goiter, thyroid nodules, or neck compression symptoms.  - We discussed about correct intake of levothyroxine, at fasting, with water, separated by at least 30 minutes from breakfast, and separated by more than 4 hours from calcium, iron, multivitamins, acid reflux medications (PPIs). -Patient is made aware of the fact that thyroid hormone replacement is needed for life, dose to be adjusted by periodic monitoring of thyroid function tests.     I spent 30 minutes in the care of the patient today including review of labs from Thyroid Function,  CMP,  and other relevant labs ; imaging/biopsy records (current and previous including abstractions from other facilities); face-to-face time discussing  her lab results and symptoms, medications doses, her options of short and long term treatment based on the latest standards of care / guidelines;   and documenting the encounter.  Whitney Reed  participated in the discussions, expressed understanding, and voiced agreement with the above plans.  All questions were answered to her satisfaction. she is encouraged to contact clinic should she have any questions or concerns prior to her return visit.   FOLLOW UP PLAN:  Return in about 4 months (around 06/29/2022) for Thyroid follow up, labs today and again prior to next visit.    Rayetta Pigg, [facility] [address] [place], [place] [address] Phone: [phone] Fax: [phone]  02/27/2022, 2:29 PM

## 2022-02-27 NOTE — Patient Instructions (Signed)

## 2022-02-28 DIAGNOSIS — J019 Acute sinusitis, unspecified: Secondary | ICD-10-CM | POA: Diagnosis not present

## 2022-02-28 DIAGNOSIS — J209 Acute bronchitis, unspecified: Secondary | ICD-10-CM | POA: Diagnosis not present

## 2022-02-28 DIAGNOSIS — Z6841 Body Mass Index (BMI) 40.0 and over, adult: Secondary | ICD-10-CM | POA: Diagnosis not present

## 2022-02-28 LAB — T4, FREE: Free T4: 0.73 ng/dL — ABNORMAL LOW (ref 0.82–1.77)

## 2022-02-28 LAB — TSH: TSH: 41.3 u[IU]/mL — ABNORMAL HIGH (ref 0.450–4.500)

## 2022-03-27 DIAGNOSIS — M8589 Other specified disorders of bone density and structure, multiple sites: Secondary | ICD-10-CM | POA: Diagnosis not present

## 2022-03-27 DIAGNOSIS — M810 Age-related osteoporosis without current pathological fracture: Secondary | ICD-10-CM | POA: Diagnosis not present

## 2022-05-01 DIAGNOSIS — G43909 Migraine, unspecified, not intractable, without status migrainosus: Secondary | ICD-10-CM | POA: Diagnosis not present

## 2022-05-01 DIAGNOSIS — Z23 Encounter for immunization: Secondary | ICD-10-CM | POA: Diagnosis not present

## 2022-05-01 DIAGNOSIS — Z6841 Body Mass Index (BMI) 40.0 and over, adult: Secondary | ICD-10-CM | POA: Diagnosis not present

## 2022-05-01 DIAGNOSIS — R030 Elevated blood-pressure reading, without diagnosis of hypertension: Secondary | ICD-10-CM | POA: Diagnosis not present

## 2022-05-01 DIAGNOSIS — E039 Hypothyroidism, unspecified: Secondary | ICD-10-CM | POA: Diagnosis not present

## 2022-05-01 DIAGNOSIS — M19041 Primary osteoarthritis, right hand: Secondary | ICD-10-CM | POA: Diagnosis not present

## 2022-05-01 DIAGNOSIS — M1712 Unilateral primary osteoarthritis, left knee: Secondary | ICD-10-CM | POA: Diagnosis not present

## 2022-05-01 LAB — TSH: TSH: 12.8 — AB (ref 0.41–5.90)

## 2022-05-09 ENCOUNTER — Other Ambulatory Visit: Payer: Self-pay | Admitting: *Deleted

## 2022-05-09 DIAGNOSIS — S8992XS Unspecified injury of left lower leg, sequela: Secondary | ICD-10-CM | POA: Diagnosis not present

## 2022-05-09 DIAGNOSIS — M21062 Valgus deformity, not elsewhere classified, left knee: Secondary | ICD-10-CM | POA: Diagnosis not present

## 2022-05-09 DIAGNOSIS — M170 Bilateral primary osteoarthritis of knee: Secondary | ICD-10-CM | POA: Diagnosis not present

## 2022-05-10 ENCOUNTER — Encounter: Payer: Self-pay | Admitting: Internal Medicine

## 2022-06-01 DIAGNOSIS — E038 Other specified hypothyroidism: Secondary | ICD-10-CM | POA: Diagnosis not present

## 2022-06-01 DIAGNOSIS — E063 Autoimmune thyroiditis: Secondary | ICD-10-CM | POA: Diagnosis not present

## 2022-06-02 DIAGNOSIS — H35373 Puckering of macula, bilateral: Secondary | ICD-10-CM | POA: Diagnosis not present

## 2022-06-02 DIAGNOSIS — Z7984 Long term (current) use of oral hypoglycemic drugs: Secondary | ICD-10-CM | POA: Diagnosis not present

## 2022-06-02 DIAGNOSIS — Z961 Presence of intraocular lens: Secondary | ICD-10-CM | POA: Diagnosis not present

## 2022-06-02 DIAGNOSIS — E119 Type 2 diabetes mellitus without complications: Secondary | ICD-10-CM | POA: Diagnosis not present

## 2022-06-02 DIAGNOSIS — H16223 Keratoconjunctivitis sicca, not specified as Sjogren's, bilateral: Secondary | ICD-10-CM | POA: Diagnosis not present

## 2022-06-02 LAB — T4, FREE: Free T4: 1.02 ng/dL (ref 0.82–1.77)

## 2022-06-02 LAB — TSH: TSH: 9.66 u[IU]/mL — ABNORMAL HIGH (ref 0.450–4.500)

## 2022-06-05 DIAGNOSIS — M170 Bilateral primary osteoarthritis of knee: Secondary | ICD-10-CM | POA: Diagnosis not present

## 2022-06-05 DIAGNOSIS — M21062 Valgus deformity, not elsewhere classified, left knee: Secondary | ICD-10-CM | POA: Diagnosis not present

## 2022-06-05 DIAGNOSIS — M79672 Pain in left foot: Secondary | ICD-10-CM | POA: Diagnosis not present

## 2022-06-09 DIAGNOSIS — G43909 Migraine, unspecified, not intractable, without status migrainosus: Secondary | ICD-10-CM | POA: Diagnosis not present

## 2022-06-09 DIAGNOSIS — R030 Elevated blood-pressure reading, without diagnosis of hypertension: Secondary | ICD-10-CM | POA: Diagnosis not present

## 2022-06-14 DIAGNOSIS — E039 Hypothyroidism, unspecified: Secondary | ICD-10-CM | POA: Diagnosis not present

## 2022-06-14 DIAGNOSIS — Z6841 Body Mass Index (BMI) 40.0 and over, adult: Secondary | ICD-10-CM | POA: Diagnosis not present

## 2022-06-14 DIAGNOSIS — F419 Anxiety disorder, unspecified: Secondary | ICD-10-CM | POA: Diagnosis not present

## 2022-06-14 DIAGNOSIS — E1165 Type 2 diabetes mellitus with hyperglycemia: Secondary | ICD-10-CM | POA: Diagnosis not present

## 2022-06-14 DIAGNOSIS — L821 Other seborrheic keratosis: Secondary | ICD-10-CM | POA: Diagnosis not present

## 2022-06-14 DIAGNOSIS — B370 Candidal stomatitis: Secondary | ICD-10-CM | POA: Diagnosis not present

## 2022-06-14 DIAGNOSIS — K219 Gastro-esophageal reflux disease without esophagitis: Secondary | ICD-10-CM | POA: Diagnosis not present

## 2022-06-14 DIAGNOSIS — G43909 Migraine, unspecified, not intractable, without status migrainosus: Secondary | ICD-10-CM | POA: Diagnosis not present

## 2022-06-28 NOTE — Patient Instructions (Incomplete)

## 2022-06-29 ENCOUNTER — Ambulatory Visit: Payer: Medicare PPO | Admitting: Nurse Practitioner

## 2022-06-29 DIAGNOSIS — E063 Autoimmune thyroiditis: Secondary | ICD-10-CM

## 2022-07-31 DIAGNOSIS — R059 Cough, unspecified: Secondary | ICD-10-CM | POA: Diagnosis not present

## 2022-07-31 DIAGNOSIS — E1165 Type 2 diabetes mellitus with hyperglycemia: Secondary | ICD-10-CM | POA: Diagnosis not present

## 2022-07-31 DIAGNOSIS — R030 Elevated blood-pressure reading, without diagnosis of hypertension: Secondary | ICD-10-CM | POA: Diagnosis not present

## 2022-08-14 DIAGNOSIS — E039 Hypothyroidism, unspecified: Secondary | ICD-10-CM | POA: Diagnosis not present

## 2022-08-14 DIAGNOSIS — G43909 Migraine, unspecified, not intractable, without status migrainosus: Secondary | ICD-10-CM | POA: Diagnosis not present

## 2022-08-14 DIAGNOSIS — E1165 Type 2 diabetes mellitus with hyperglycemia: Secondary | ICD-10-CM | POA: Diagnosis not present

## 2022-08-14 DIAGNOSIS — J209 Acute bronchitis, unspecified: Secondary | ICD-10-CM | POA: Diagnosis not present

## 2022-08-14 DIAGNOSIS — K219 Gastro-esophageal reflux disease without esophagitis: Secondary | ICD-10-CM | POA: Diagnosis not present

## 2022-08-14 DIAGNOSIS — R030 Elevated blood-pressure reading, without diagnosis of hypertension: Secondary | ICD-10-CM | POA: Diagnosis not present

## 2022-09-12 DIAGNOSIS — E1165 Type 2 diabetes mellitus with hyperglycemia: Secondary | ICD-10-CM | POA: Diagnosis not present

## 2022-09-12 DIAGNOSIS — R030 Elevated blood-pressure reading, without diagnosis of hypertension: Secondary | ICD-10-CM | POA: Diagnosis not present

## 2022-09-12 DIAGNOSIS — G43909 Migraine, unspecified, not intractable, without status migrainosus: Secondary | ICD-10-CM | POA: Diagnosis not present

## 2022-09-12 DIAGNOSIS — K219 Gastro-esophageal reflux disease without esophagitis: Secondary | ICD-10-CM | POA: Diagnosis not present

## 2022-09-12 DIAGNOSIS — E559 Vitamin D deficiency, unspecified: Secondary | ICD-10-CM | POA: Diagnosis not present

## 2022-09-12 DIAGNOSIS — E039 Hypothyroidism, unspecified: Secondary | ICD-10-CM | POA: Diagnosis not present

## 2022-09-13 DIAGNOSIS — G43909 Migraine, unspecified, not intractable, without status migrainosus: Secondary | ICD-10-CM | POA: Diagnosis not present

## 2022-09-13 DIAGNOSIS — K219 Gastro-esophageal reflux disease without esophagitis: Secondary | ICD-10-CM | POA: Diagnosis not present

## 2022-09-13 DIAGNOSIS — E1165 Type 2 diabetes mellitus with hyperglycemia: Secondary | ICD-10-CM | POA: Diagnosis not present

## 2022-09-13 DIAGNOSIS — E039 Hypothyroidism, unspecified: Secondary | ICD-10-CM | POA: Diagnosis not present

## 2022-10-04 DIAGNOSIS — M2352 Chronic instability of knee, left knee: Secondary | ICD-10-CM | POA: Diagnosis not present

## 2022-10-04 DIAGNOSIS — M170 Bilateral primary osteoarthritis of knee: Secondary | ICD-10-CM | POA: Diagnosis not present

## 2022-10-04 DIAGNOSIS — M79672 Pain in left foot: Secondary | ICD-10-CM | POA: Diagnosis not present

## 2022-10-23 DIAGNOSIS — M19072 Primary osteoarthritis, left ankle and foot: Secondary | ICD-10-CM | POA: Diagnosis not present

## 2022-12-07 DIAGNOSIS — M25511 Pain in right shoulder: Secondary | ICD-10-CM | POA: Diagnosis not present

## 2022-12-07 DIAGNOSIS — G43909 Migraine, unspecified, not intractable, without status migrainosus: Secondary | ICD-10-CM | POA: Diagnosis not present

## 2022-12-07 DIAGNOSIS — R030 Elevated blood-pressure reading, without diagnosis of hypertension: Secondary | ICD-10-CM | POA: Diagnosis not present

## 2022-12-07 DIAGNOSIS — E1165 Type 2 diabetes mellitus with hyperglycemia: Secondary | ICD-10-CM | POA: Diagnosis not present

## 2022-12-07 DIAGNOSIS — K219 Gastro-esophageal reflux disease without esophagitis: Secondary | ICD-10-CM | POA: Diagnosis not present

## 2022-12-07 DIAGNOSIS — E039 Hypothyroidism, unspecified: Secondary | ICD-10-CM | POA: Diagnosis not present

## 2023-01-03 DIAGNOSIS — M170 Bilateral primary osteoarthritis of knee: Secondary | ICD-10-CM | POA: Diagnosis not present

## 2023-01-03 DIAGNOSIS — M21062 Valgus deformity, not elsewhere classified, left knee: Secondary | ICD-10-CM | POA: Diagnosis not present

## 2023-03-19 DIAGNOSIS — M79672 Pain in left foot: Secondary | ICD-10-CM | POA: Diagnosis not present

## 2023-03-19 DIAGNOSIS — M19072 Primary osteoarthritis, left ankle and foot: Secondary | ICD-10-CM | POA: Diagnosis not present

## 2023-03-20 DIAGNOSIS — E039 Hypothyroidism, unspecified: Secondary | ICD-10-CM | POA: Diagnosis not present

## 2023-03-20 DIAGNOSIS — Z23 Encounter for immunization: Secondary | ICD-10-CM | POA: Diagnosis not present

## 2023-03-20 DIAGNOSIS — R030 Elevated blood-pressure reading, without diagnosis of hypertension: Secondary | ICD-10-CM | POA: Diagnosis not present

## 2023-03-20 DIAGNOSIS — G43909 Migraine, unspecified, not intractable, without status migrainosus: Secondary | ICD-10-CM | POA: Diagnosis not present

## 2023-03-20 DIAGNOSIS — E1165 Type 2 diabetes mellitus with hyperglycemia: Secondary | ICD-10-CM | POA: Diagnosis not present

## 2023-03-20 DIAGNOSIS — K219 Gastro-esophageal reflux disease without esophagitis: Secondary | ICD-10-CM | POA: Diagnosis not present

## 2023-04-10 DIAGNOSIS — M170 Bilateral primary osteoarthritis of knee: Secondary | ICD-10-CM | POA: Diagnosis not present

## 2023-04-10 DIAGNOSIS — M21062 Valgus deformity, not elsewhere classified, left knee: Secondary | ICD-10-CM | POA: Diagnosis not present

## 2023-06-04 DIAGNOSIS — E039 Hypothyroidism, unspecified: Secondary | ICD-10-CM | POA: Diagnosis not present

## 2023-06-04 DIAGNOSIS — E1165 Type 2 diabetes mellitus with hyperglycemia: Secondary | ICD-10-CM | POA: Diagnosis not present

## 2023-06-04 DIAGNOSIS — J209 Acute bronchitis, unspecified: Secondary | ICD-10-CM | POA: Diagnosis not present

## 2023-06-04 DIAGNOSIS — K219 Gastro-esophageal reflux disease without esophagitis: Secondary | ICD-10-CM | POA: Diagnosis not present

## 2023-06-04 DIAGNOSIS — D72829 Elevated white blood cell count, unspecified: Secondary | ICD-10-CM | POA: Diagnosis not present

## 2023-06-04 DIAGNOSIS — G43909 Migraine, unspecified, not intractable, without status migrainosus: Secondary | ICD-10-CM | POA: Diagnosis not present

## 2023-06-04 DIAGNOSIS — R030 Elevated blood-pressure reading, without diagnosis of hypertension: Secondary | ICD-10-CM | POA: Diagnosis not present

## 2023-07-11 DIAGNOSIS — M21062 Valgus deformity, not elsewhere classified, left knee: Secondary | ICD-10-CM | POA: Diagnosis not present

## 2023-07-11 DIAGNOSIS — M1712 Unilateral primary osteoarthritis, left knee: Secondary | ICD-10-CM | POA: Diagnosis not present

## 2023-09-05 DIAGNOSIS — E785 Hyperlipidemia, unspecified: Secondary | ICD-10-CM | POA: Diagnosis not present

## 2023-09-05 DIAGNOSIS — G43909 Migraine, unspecified, not intractable, without status migrainosus: Secondary | ICD-10-CM | POA: Diagnosis not present

## 2023-09-05 DIAGNOSIS — E1165 Type 2 diabetes mellitus with hyperglycemia: Secondary | ICD-10-CM | POA: Diagnosis not present

## 2023-09-05 DIAGNOSIS — R5383 Other fatigue: Secondary | ICD-10-CM | POA: Diagnosis not present

## 2023-09-05 DIAGNOSIS — E039 Hypothyroidism, unspecified: Secondary | ICD-10-CM | POA: Diagnosis not present

## 2023-09-13 DIAGNOSIS — S39012A Strain of muscle, fascia and tendon of lower back, initial encounter: Secondary | ICD-10-CM | POA: Diagnosis not present

## 2023-09-13 DIAGNOSIS — S29012A Strain of muscle and tendon of back wall of thorax, initial encounter: Secondary | ICD-10-CM | POA: Diagnosis not present

## 2023-09-13 DIAGNOSIS — M9901 Segmental and somatic dysfunction of cervical region: Secondary | ICD-10-CM | POA: Diagnosis not present

## 2023-09-13 DIAGNOSIS — M9902 Segmental and somatic dysfunction of thoracic region: Secondary | ICD-10-CM | POA: Diagnosis not present

## 2023-09-13 DIAGNOSIS — S134XXA Sprain of ligaments of cervical spine, initial encounter: Secondary | ICD-10-CM | POA: Diagnosis not present

## 2023-09-13 DIAGNOSIS — M9903 Segmental and somatic dysfunction of lumbar region: Secondary | ICD-10-CM | POA: Diagnosis not present

## 2023-09-14 DIAGNOSIS — M9903 Segmental and somatic dysfunction of lumbar region: Secondary | ICD-10-CM | POA: Diagnosis not present

## 2023-09-14 DIAGNOSIS — S134XXA Sprain of ligaments of cervical spine, initial encounter: Secondary | ICD-10-CM | POA: Diagnosis not present

## 2023-09-14 DIAGNOSIS — S29012A Strain of muscle and tendon of back wall of thorax, initial encounter: Secondary | ICD-10-CM | POA: Diagnosis not present

## 2023-09-14 DIAGNOSIS — M9902 Segmental and somatic dysfunction of thoracic region: Secondary | ICD-10-CM | POA: Diagnosis not present

## 2023-09-14 DIAGNOSIS — M9901 Segmental and somatic dysfunction of cervical region: Secondary | ICD-10-CM | POA: Diagnosis not present

## 2023-09-17 DIAGNOSIS — S134XXA Sprain of ligaments of cervical spine, initial encounter: Secondary | ICD-10-CM | POA: Diagnosis not present

## 2023-09-17 DIAGNOSIS — M9902 Segmental and somatic dysfunction of thoracic region: Secondary | ICD-10-CM | POA: Diagnosis not present

## 2023-09-17 DIAGNOSIS — M9903 Segmental and somatic dysfunction of lumbar region: Secondary | ICD-10-CM | POA: Diagnosis not present

## 2023-09-17 DIAGNOSIS — M9901 Segmental and somatic dysfunction of cervical region: Secondary | ICD-10-CM | POA: Diagnosis not present

## 2023-09-17 DIAGNOSIS — S29012A Strain of muscle and tendon of back wall of thorax, initial encounter: Secondary | ICD-10-CM | POA: Diagnosis not present

## 2023-09-17 DIAGNOSIS — S39012A Strain of muscle, fascia and tendon of lower back, initial encounter: Secondary | ICD-10-CM | POA: Diagnosis not present

## 2023-09-19 DIAGNOSIS — M1712 Unilateral primary osteoarthritis, left knee: Secondary | ICD-10-CM | POA: Diagnosis not present

## 2023-09-19 DIAGNOSIS — M9901 Segmental and somatic dysfunction of cervical region: Secondary | ICD-10-CM | POA: Diagnosis not present

## 2023-09-19 DIAGNOSIS — S29012A Strain of muscle and tendon of back wall of thorax, initial encounter: Secondary | ICD-10-CM | POA: Diagnosis not present

## 2023-09-19 DIAGNOSIS — M62830 Muscle spasm of back: Secondary | ICD-10-CM | POA: Diagnosis not present

## 2023-09-19 DIAGNOSIS — M9903 Segmental and somatic dysfunction of lumbar region: Secondary | ICD-10-CM | POA: Diagnosis not present

## 2023-09-19 DIAGNOSIS — S39012A Strain of muscle, fascia and tendon of lower back, initial encounter: Secondary | ICD-10-CM | POA: Diagnosis not present

## 2023-09-19 DIAGNOSIS — M21062 Valgus deformity, not elsewhere classified, left knee: Secondary | ICD-10-CM | POA: Diagnosis not present

## 2023-09-19 DIAGNOSIS — S134XXA Sprain of ligaments of cervical spine, initial encounter: Secondary | ICD-10-CM | POA: Diagnosis not present

## 2023-09-19 DIAGNOSIS — M9902 Segmental and somatic dysfunction of thoracic region: Secondary | ICD-10-CM | POA: Diagnosis not present

## 2023-09-24 DIAGNOSIS — M9903 Segmental and somatic dysfunction of lumbar region: Secondary | ICD-10-CM | POA: Diagnosis not present

## 2023-09-24 DIAGNOSIS — G43909 Migraine, unspecified, not intractable, without status migrainosus: Secondary | ICD-10-CM | POA: Diagnosis not present

## 2023-09-24 DIAGNOSIS — S134XXA Sprain of ligaments of cervical spine, initial encounter: Secondary | ICD-10-CM | POA: Diagnosis not present

## 2023-09-24 DIAGNOSIS — E1165 Type 2 diabetes mellitus with hyperglycemia: Secondary | ICD-10-CM | POA: Diagnosis not present

## 2023-09-24 DIAGNOSIS — M9901 Segmental and somatic dysfunction of cervical region: Secondary | ICD-10-CM | POA: Diagnosis not present

## 2023-09-24 DIAGNOSIS — M9902 Segmental and somatic dysfunction of thoracic region: Secondary | ICD-10-CM | POA: Diagnosis not present

## 2023-09-24 DIAGNOSIS — S29012A Strain of muscle and tendon of back wall of thorax, initial encounter: Secondary | ICD-10-CM | POA: Diagnosis not present

## 2023-09-26 DIAGNOSIS — M1712 Unilateral primary osteoarthritis, left knee: Secondary | ICD-10-CM | POA: Diagnosis not present

## 2023-09-26 DIAGNOSIS — M62830 Muscle spasm of back: Secondary | ICD-10-CM | POA: Diagnosis not present

## 2023-09-27 DIAGNOSIS — S39012A Strain of muscle, fascia and tendon of lower back, initial encounter: Secondary | ICD-10-CM | POA: Diagnosis not present

## 2023-09-27 DIAGNOSIS — S29012A Strain of muscle and tendon of back wall of thorax, initial encounter: Secondary | ICD-10-CM | POA: Diagnosis not present

## 2023-09-27 DIAGNOSIS — M9901 Segmental and somatic dysfunction of cervical region: Secondary | ICD-10-CM | POA: Diagnosis not present

## 2023-09-27 DIAGNOSIS — M9902 Segmental and somatic dysfunction of thoracic region: Secondary | ICD-10-CM | POA: Diagnosis not present

## 2023-09-27 DIAGNOSIS — S134XXA Sprain of ligaments of cervical spine, initial encounter: Secondary | ICD-10-CM | POA: Diagnosis not present

## 2023-09-27 DIAGNOSIS — M9903 Segmental and somatic dysfunction of lumbar region: Secondary | ICD-10-CM | POA: Diagnosis not present

## 2023-10-02 DIAGNOSIS — S134XXA Sprain of ligaments of cervical spine, initial encounter: Secondary | ICD-10-CM | POA: Diagnosis not present

## 2023-10-02 DIAGNOSIS — M9903 Segmental and somatic dysfunction of lumbar region: Secondary | ICD-10-CM | POA: Diagnosis not present

## 2023-10-02 DIAGNOSIS — M9902 Segmental and somatic dysfunction of thoracic region: Secondary | ICD-10-CM | POA: Diagnosis not present

## 2023-10-02 DIAGNOSIS — S29012A Strain of muscle and tendon of back wall of thorax, initial encounter: Secondary | ICD-10-CM | POA: Diagnosis not present

## 2023-10-02 DIAGNOSIS — S39012A Strain of muscle, fascia and tendon of lower back, initial encounter: Secondary | ICD-10-CM | POA: Diagnosis not present

## 2023-10-02 DIAGNOSIS — M9901 Segmental and somatic dysfunction of cervical region: Secondary | ICD-10-CM | POA: Diagnosis not present

## 2023-10-03 DIAGNOSIS — M1712 Unilateral primary osteoarthritis, left knee: Secondary | ICD-10-CM | POA: Diagnosis not present

## 2023-10-04 DIAGNOSIS — M9902 Segmental and somatic dysfunction of thoracic region: Secondary | ICD-10-CM | POA: Diagnosis not present

## 2023-10-04 DIAGNOSIS — M9903 Segmental and somatic dysfunction of lumbar region: Secondary | ICD-10-CM | POA: Diagnosis not present

## 2023-10-04 DIAGNOSIS — S134XXA Sprain of ligaments of cervical spine, initial encounter: Secondary | ICD-10-CM | POA: Diagnosis not present

## 2023-10-04 DIAGNOSIS — S29012A Strain of muscle and tendon of back wall of thorax, initial encounter: Secondary | ICD-10-CM | POA: Diagnosis not present

## 2023-10-04 DIAGNOSIS — M9901 Segmental and somatic dysfunction of cervical region: Secondary | ICD-10-CM | POA: Diagnosis not present

## 2023-10-09 DIAGNOSIS — M9903 Segmental and somatic dysfunction of lumbar region: Secondary | ICD-10-CM | POA: Diagnosis not present

## 2023-10-09 DIAGNOSIS — S134XXA Sprain of ligaments of cervical spine, initial encounter: Secondary | ICD-10-CM | POA: Diagnosis not present

## 2023-10-09 DIAGNOSIS — S29012A Strain of muscle and tendon of back wall of thorax, initial encounter: Secondary | ICD-10-CM | POA: Diagnosis not present

## 2023-10-09 DIAGNOSIS — M9901 Segmental and somatic dysfunction of cervical region: Secondary | ICD-10-CM | POA: Diagnosis not present

## 2023-10-09 DIAGNOSIS — M9902 Segmental and somatic dysfunction of thoracic region: Secondary | ICD-10-CM | POA: Diagnosis not present

## 2023-10-11 DIAGNOSIS — M9903 Segmental and somatic dysfunction of lumbar region: Secondary | ICD-10-CM | POA: Diagnosis not present

## 2023-10-11 DIAGNOSIS — M9902 Segmental and somatic dysfunction of thoracic region: Secondary | ICD-10-CM | POA: Diagnosis not present

## 2023-10-11 DIAGNOSIS — S134XXA Sprain of ligaments of cervical spine, initial encounter: Secondary | ICD-10-CM | POA: Diagnosis not present

## 2023-10-11 DIAGNOSIS — S39012A Strain of muscle, fascia and tendon of lower back, initial encounter: Secondary | ICD-10-CM | POA: Diagnosis not present

## 2023-10-11 DIAGNOSIS — M9901 Segmental and somatic dysfunction of cervical region: Secondary | ICD-10-CM | POA: Diagnosis not present

## 2023-10-11 DIAGNOSIS — S29012A Strain of muscle and tendon of back wall of thorax, initial encounter: Secondary | ICD-10-CM | POA: Diagnosis not present

## 2023-10-17 DIAGNOSIS — M1712 Unilateral primary osteoarthritis, left knee: Secondary | ICD-10-CM | POA: Diagnosis not present

## 2023-10-17 DIAGNOSIS — M62830 Muscle spasm of back: Secondary | ICD-10-CM | POA: Diagnosis not present

## 2023-10-17 DIAGNOSIS — M170 Bilateral primary osteoarthritis of knee: Secondary | ICD-10-CM | POA: Diagnosis not present

## 2023-10-17 DIAGNOSIS — M21062 Valgus deformity, not elsewhere classified, left knee: Secondary | ICD-10-CM | POA: Diagnosis not present

## 2023-10-31 DIAGNOSIS — S29012A Strain of muscle and tendon of back wall of thorax, initial encounter: Secondary | ICD-10-CM | POA: Diagnosis not present

## 2023-10-31 DIAGNOSIS — M9901 Segmental and somatic dysfunction of cervical region: Secondary | ICD-10-CM | POA: Diagnosis not present

## 2023-10-31 DIAGNOSIS — M9902 Segmental and somatic dysfunction of thoracic region: Secondary | ICD-10-CM | POA: Diagnosis not present

## 2023-10-31 DIAGNOSIS — S134XXA Sprain of ligaments of cervical spine, initial encounter: Secondary | ICD-10-CM | POA: Diagnosis not present

## 2023-10-31 DIAGNOSIS — M9903 Segmental and somatic dysfunction of lumbar region: Secondary | ICD-10-CM | POA: Diagnosis not present

## 2023-11-28 DIAGNOSIS — M170 Bilateral primary osteoarthritis of knee: Secondary | ICD-10-CM | POA: Diagnosis not present

## 2023-11-28 DIAGNOSIS — M1712 Unilateral primary osteoarthritis, left knee: Secondary | ICD-10-CM | POA: Diagnosis not present

## 2023-11-28 DIAGNOSIS — M21062 Valgus deformity, not elsewhere classified, left knee: Secondary | ICD-10-CM | POA: Diagnosis not present

## 2023-12-24 DIAGNOSIS — E1165 Type 2 diabetes mellitus with hyperglycemia: Secondary | ICD-10-CM | POA: Diagnosis not present

## 2023-12-24 DIAGNOSIS — D519 Vitamin B12 deficiency anemia, unspecified: Secondary | ICD-10-CM | POA: Diagnosis not present

## 2023-12-24 DIAGNOSIS — E559 Vitamin D deficiency, unspecified: Secondary | ICD-10-CM | POA: Diagnosis not present

## 2023-12-24 DIAGNOSIS — G43909 Migraine, unspecified, not intractable, without status migrainosus: Secondary | ICD-10-CM | POA: Diagnosis not present

## 2023-12-24 DIAGNOSIS — E039 Hypothyroidism, unspecified: Secondary | ICD-10-CM | POA: Diagnosis not present

## 2023-12-24 DIAGNOSIS — R5383 Other fatigue: Secondary | ICD-10-CM | POA: Diagnosis not present

## 2024-01-01 DIAGNOSIS — R221 Localized swelling, mass and lump, neck: Secondary | ICD-10-CM | POA: Diagnosis not present

## 2024-01-01 DIAGNOSIS — E039 Hypothyroidism, unspecified: Secondary | ICD-10-CM | POA: Diagnosis not present

## 2024-01-01 DIAGNOSIS — E010 Iodine-deficiency related diffuse (endemic) goiter: Secondary | ICD-10-CM | POA: Diagnosis not present

## 2024-02-28 DIAGNOSIS — M25561 Pain in right knee: Secondary | ICD-10-CM | POA: Diagnosis not present

## 2024-02-28 DIAGNOSIS — G8929 Other chronic pain: Secondary | ICD-10-CM | POA: Diagnosis not present

## 2024-02-28 DIAGNOSIS — M25562 Pain in left knee: Secondary | ICD-10-CM | POA: Diagnosis not present

## 2024-02-28 DIAGNOSIS — M62830 Muscle spasm of back: Secondary | ICD-10-CM | POA: Diagnosis not present

## 2024-02-28 DIAGNOSIS — M21062 Valgus deformity, not elsewhere classified, left knee: Secondary | ICD-10-CM | POA: Diagnosis not present

## 2024-02-28 DIAGNOSIS — M170 Bilateral primary osteoarthritis of knee: Secondary | ICD-10-CM | POA: Diagnosis not present

## 2024-04-17 DIAGNOSIS — G8929 Other chronic pain: Secondary | ICD-10-CM | POA: Diagnosis not present

## 2024-04-17 DIAGNOSIS — M1712 Unilateral primary osteoarthritis, left knee: Secondary | ICD-10-CM | POA: Diagnosis not present

## 2024-04-17 DIAGNOSIS — M62830 Muscle spasm of back: Secondary | ICD-10-CM | POA: Diagnosis not present

## 2024-04-17 DIAGNOSIS — M21062 Valgus deformity, not elsewhere classified, left knee: Secondary | ICD-10-CM | POA: Diagnosis not present

## 2024-04-25 DIAGNOSIS — M1712 Unilateral primary osteoarthritis, left knee: Secondary | ICD-10-CM | POA: Diagnosis not present

## 2024-05-05 DIAGNOSIS — M1712 Unilateral primary osteoarthritis, left knee: Secondary | ICD-10-CM | POA: Diagnosis not present

## 2024-07-28 NOTE — Progress Notes (Signed)
 Whitney Reed                                          MRN: [id number]   07/28/2024   The [facility] Quality Team Specialist reviewed this patient medical record for the purposes of chart review for care gap closure. The following were reviewed: chart review for care gap closure-glycemic status assessment.    [facility] Quality Team

## 2024-08-01 ENCOUNTER — Encounter: Payer: Self-pay | Admitting: *Deleted

## 2024-08-01 NOTE — Progress Notes (Signed)
 TAMALA MANZER                                          MRN: [id number]   08/01/2024   The [facility] Quality Team Specialist reviewed this patient medical record for the purposes of chart review for care gap closure. The following were reviewed: chart review for care gap closure-glycemic status assessment.    [facility] Quality Team
